# Patient Record
Sex: Female | Born: 1935 | Race: White | Hispanic: No | Marital: Single | Smoking: Never smoker
Health system: Southern US, Community
[De-identification: ages and names within clinical notes are randomized; demographics above are authoritative.]

## PROBLEM LIST (undated history)

## (undated) DIAGNOSIS — I10 Essential (primary) hypertension: Secondary | ICD-10-CM

## (undated) DIAGNOSIS — E785 Hyperlipidemia, unspecified: Secondary | ICD-10-CM

## (undated) DIAGNOSIS — E042 Nontoxic multinodular goiter: Secondary | ICD-10-CM

## (undated) HISTORY — DX: Hyperlipidemia, unspecified: E78.5

## (undated) HISTORY — DX: Nontoxic multinodular goiter: E04.2

## (undated) HISTORY — DX: Essential (primary) hypertension: I10

## (undated) HISTORY — PX: ABDOMINAL HYSTERECTOMY: SHX81

---

## 2007-02-16 ENCOUNTER — Encounter: Payer: Self-pay | Admitting: Family Medicine

## 2007-10-26 ENCOUNTER — Encounter: Payer: Self-pay | Admitting: Family Medicine

## 2008-04-10 ENCOUNTER — Encounter: Payer: Self-pay | Admitting: Family Medicine

## 2009-09-26 ENCOUNTER — Ambulatory Visit: Payer: Self-pay | Admitting: Family Medicine

## 2009-09-26 DIAGNOSIS — E041 Nontoxic single thyroid nodule: Secondary | ICD-10-CM | POA: Insufficient documentation

## 2009-09-26 DIAGNOSIS — R030 Elevated blood-pressure reading, without diagnosis of hypertension: Secondary | ICD-10-CM | POA: Insufficient documentation

## 2009-09-26 DIAGNOSIS — R1012 Left upper quadrant pain: Secondary | ICD-10-CM | POA: Insufficient documentation

## 2009-09-27 DIAGNOSIS — E785 Hyperlipidemia, unspecified: Secondary | ICD-10-CM

## 2009-09-29 LAB — CONVERTED CEMR LAB
ALT: 18 units/L (ref 0–35)
Albumin: 4.3 g/dL (ref 3.5–5.2)
Alkaline Phosphatase: 53 units/L (ref 39–117)
BUN: 12 mg/dL (ref 6–23)
Creatinine, Ser: 0.89 mg/dL (ref 0.40–1.20)
HDL: 69 mg/dL (ref >39)
Hemoglobin: 14.7 g/dL (ref 12.0–15.0)
Lymphs Abs: 1.9 10*3/uL (ref 0.7–4.0)
MCV: 89.1 fL (ref 78.0–100.0)
Monocytes Relative: 7 % (ref 3–12)
Neutrophils Relative %: 62 % (ref 43–77)
Platelets: 198 10*3/uL (ref 150–400)
Potassium: 4.3 meq/L (ref 3.5–5.3)
RBC: 5.04 M/uL (ref 3.87–5.11)
Triglycerides: 91 mg/dL (ref <150)
VLDL: 18 mg/dL (ref 0–40)
WBC: 6.3 10*3/uL (ref 4.0–10.5)

## 2009-11-27 ENCOUNTER — Ambulatory Visit: Payer: Self-pay | Admitting: Family Medicine

## 2009-11-27 DIAGNOSIS — I499 Cardiac arrhythmia, unspecified: Secondary | ICD-10-CM | POA: Insufficient documentation

## 2009-11-27 DIAGNOSIS — L719 Rosacea, unspecified: Secondary | ICD-10-CM

## 2009-11-27 DIAGNOSIS — Z780 Asymptomatic menopausal state: Secondary | ICD-10-CM | POA: Insufficient documentation

## 2009-12-11 ENCOUNTER — Telehealth (INDEPENDENT_AMBULATORY_CARE_PROVIDER_SITE_OTHER): Payer: Self-pay | Admitting: *Deleted

## 2009-12-31 ENCOUNTER — Ambulatory Visit: Payer: Self-pay | Admitting: Family Medicine

## 2010-01-01 ENCOUNTER — Telehealth: Payer: Self-pay | Admitting: Family Medicine

## 2010-01-05 ENCOUNTER — Ambulatory Visit: Payer: Self-pay | Admitting: Family Medicine

## 2010-01-05 LAB — CONVERTED CEMR LAB: OCCULT 1: POSITIVE

## 2010-01-06 ENCOUNTER — Encounter: Admission: RE | Admit: 2010-01-06 | Discharge: 2010-01-06 | Payer: Self-pay | Admitting: Family Medicine

## 2010-01-06 ENCOUNTER — Encounter: Payer: Self-pay | Admitting: Family Medicine

## 2010-01-06 LAB — HM MAMMOGRAPHY: HM Mammogram: NEGATIVE

## 2010-01-08 DIAGNOSIS — M810 Age-related osteoporosis without current pathological fracture: Secondary | ICD-10-CM

## 2010-01-20 LAB — HM COLONOSCOPY: HM Colonoscopy: NORMAL

## 2010-01-21 ENCOUNTER — Encounter: Payer: Self-pay | Admitting: Family Medicine

## 2010-02-05 ENCOUNTER — Encounter: Payer: Self-pay | Admitting: Family Medicine

## 2010-09-15 NOTE — Assessment & Plan Note (Signed)
Summary: CPE   Vital Signs:  Patient profile:   77 year old female Height:      63.5 inches Weight:      127 pounds BMI:     22.22 O2 Sat:      99 % on Room air Pulse rate:   72 / minute BP sitting:   158 / 82  (left arm) Cuff size:   regular  Vitals Entered By: Payton Spark CMA (November 27, 2009 8:15 AM)  O2 Flow:  Room air CC: CPE   Primary Care Provider:  Seymour Bars DO  CC:  CPE.  History of Present Illness: 77 yo WF postmenopausal here with CPE w/o pap.  She had a TAH for fibroids years ago, not on HRT.  Denies vag bleeding, dishcarge or pelvic pain.  She is divorced and not sexually active.  I do not have her records from [place] for her vaccine hx.  She has hx of HTN and High chol but she is not taking any meds.  Her labs were updated in Feb including TSH for hx of thyroid nodules but all was normal.  Her mammogram and DEXA are due.  She is overdue for a colonoscopy but refuses.  She agrees to do stool cards.  She is starting to walk more.  Denies CP or DOE.  Eats healthy.  She plans to call Emire[facility] for annual eye exam.    Current Medications (verified): 1)  None  Allergies (verified): 1)  ! Lipitor  Past History:  Past Medical History: Reviewed history from 09/26/2009 and no changes required. HTN High chol, refused statins after having myalgias Hx of thyroid nodules  Past Surgical History: Reviewed history from 09/26/2009 and no changes required. TAH w/o oophorectomy for fibroids  Family History: Reviewed history from 09/26/2009 and no changes required. brother committed suicide at age 46 mother died in her 102s from CHF father died in his 96s, AMI Sister died premenopausal breast cancer  Social History: Reviewed history from 09/26/2009 and no changes required. Homemaker.  Divorced. Has 3 sons and 6 grandkids all local. Never smoked.  Denies ETOH. Walks for exercise.  Review of Systems  The patient denies anorexia, fever, weight loss, weight  gain, vision loss, decreased hearing, hoarseness, chest pain, syncope, dyspnea on exertion, peripheral edema, prolonged cough, headaches, hemoptysis, abdominal pain, melena, hematochezia, severe indigestion/heartburn, hematuria, incontinence, genital sores, muscle weakness, suspicious skin lesions, transient blindness, difficulty walking, depression, unusual weight change, abnormal bleeding, enlarged lymph nodes, angioedema, breast masses, and testicular masses.    Physical Exam  General:  alert, well-developed, well-nourished, and well-hydrated.   Head:  normocephalic and atraumatic.   Eyes:  clouding of the anterior chambers bilat, PERRLA Ears:  TM scaring o/w normal bilat Nose:  no nasal discharge.   Mouth:  good dentition and pharynx pink and moist.   Neck:  no masses.  no audible carotid bruits Breasts:  No mass, nodules, thickening, tenderness, bulging, retraction, inflamation, nipple discharge or skin changes noted.   Lungs:  Normal respiratory effort, chest expands symmetrically. Lungs are clear to auscultation, no crackles or wheezes. Heart:  Reg rate with short pauses, no rubs or gallops.  no audible murmurs Abdomen:  Bowel sounds positive,abdomen soft and non-tender without masses, organomegaly or hernias noted. Msk:  arthritis nodes over finger joints bilat foot bunions Pulses:  2+ radial and pedal pulses Extremities:  no UE or LE edema Neurologic:  gait normal.   Skin:  mild rosacea.  abundant SKs and  sun damaged skin over upper chest Cervical Nodes:  No lymphadenopathy noted Psych:  good eye contact, not anxious appearing, and not depressed appearing.     Impression & Recommendations:  Problem # 1:  ROUTINE GENERAL MEDICAL EXAM@HEALTH  CARE FACL (ICD-V70.0) Keeping healthy checklist for women reviewed. BP high.  I am going to start her on DASH diet + regular walking routine and recheck at a nurse visit in 3 wks.  If still >140/90, start BP meds. BMI at goal. Update  mammogram and DEXA. Labs updated 09-2009. Need records for immunization hx.   EKG sinus brady at 59 bpm - normal axis, no ischemia. Pt will  update dental and eye exam. Will refer to derm locally. Stool cards given to pt.  Other Orders: T-Mammography Bilateral Screening (09381) T-DXA Bone Density/ Appendicular (82993) T-Dual DXA Bone Density/ Axial (71696) Dermatology Referral (Derma) EKG w/ Interpretation (93000)  Patient Instructions: 1)  Work on low sodium diet with walking 45 min 4-5 days/ wk. 2)  REturn for a nurse visit BP check in 3 wks.  If >140/90, will start BP meds. 3)  Update mammogram and DEXA downstairs. 4)  Labs normal Feb 2011. 5)  Call dentist and eye doctor for appts. 6)  Will set you up with local deramatologist.

## 2010-09-15 NOTE — Letter (Signed)
Summary: Jasmine December MD  Jasmine December MD   Imported By: Lanelle Bal 02/18/2010 10:14:44  _____________________________________________________________________  External Attachment:    Type:   Image     Comment:   External Document

## 2010-09-15 NOTE — Miscellaneous (Signed)
Summary: colonoscopy  Clinical Lists Changes  Observations: Added new observation of PAST MED HX: HTN High chol, refused statins after having myalgias Hx of thyroid nodules  GI Dr Jacqulyn Bath ([facility]) (01/21/2010 11:59) Added new observation of PRIMARY MD: Seymour Bars DO (01/21/2010 11:59) Added new observation of COLONRECACT: Further recommendations pending biopsy results.   (01/20/2010 12:00) Added new observation of COLONOSCOPY: Location:  [facility]   nodule ascending colon, biopsied. o/w normal (01/20/2010 12:00)      Colonoscopy  Procedure date:  01/20/2010  Findings:      Location:  [facility]   nodule ascending colon, biopsied. o/w normal  Comments:      Further recommendations pending biopsy results.     Colonoscopy  Procedure date:  01/20/2010  Findings:      Location:  [facility]   nodule ascending colon, biopsied. o/w normal  Comments:      Further recommendations pending biopsy results.       Past History:  Past Medical History: HTN High chol, refused statins after having myalgias Hx of thyroid nodules  GI Dr Jacqulyn Bath ([facility])

## 2010-09-15 NOTE — Miscellaneous (Signed)
Summary: old records  Clinical Lists Changes  Observations: Added new observation of PAST MED HX: HTN High chol, refused statins after having myalgias Hx of thyroid nodules- 09 last U/S sensorineural hearing loss  GI Dr Jacqulyn Bath ([facility]) Stress test (nuclear) 09: normal but cannot r/o mild ischemia; EF 66% (02/05/2010 8:30) Added new observation of PRIMARY MD: Seymour Bars DO (02/05/2010 8:30)       Past History:  Past Medical History: HTN High chol, refused statins after having myalgias Hx of thyroid nodules- 09 last U/S sensorineural hearing loss  GI Dr Jacqulyn Bath ([facility]) Stress test (nuclear) 09: normal but cannot r/o mild ischemia; EF 66%

## 2010-09-15 NOTE — Progress Notes (Signed)
Summary: Apt  Phone Note Call from Patient   Summary of Call: Pt LMOM asking if she had apt tomorrow. I tried to call Pt back to inform her that she is not scheduled tomorrow but did not get an answer. Pts next scheduled apt is 12/18/09 for a nurse visit. Initial call taken by: Payton Spark CMA,  December 11, 2009 4:34 PM
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
## 2010-09-15 NOTE — Assessment & Plan Note (Signed)
Summary: Pneumovax / stool cards  Nurse Visit   Allergies: 1)  ! Lipitor Laboratory Results  Date/Time Received: 01/05/2010 Date/Time Reported: 01/05/2010  Stool - Occult Blood Hemmoccult #1: positive Hemoccult #2: positive Hemoccult #3: positive   Immunizations Administered:  Pneumonia Vaccine:    Vaccine Type: Pneumovax    Site: left deltoid    Mfr: Merck    Dose: 0.5 ml    Route: IM    Given by: Kathlene November    Exp. Date: 03/28/2011    Lot #: [id number]    VIS given: 03/13/96 version given Jan 05, 2010.  Orders Added: 1)  Pneumococcal Vaccine [90732] 2)  Admin 1st Vaccine [90471] 3)  Hemoccult Cards -3 specimans (take home) [82272] 4)  Gastroenterology Referral [GI]  Appended Document: Pneumovax / stool cards Pt notified of results and referral made to [facility]. KJ LPN

## 2010-09-15 NOTE — Assessment & Plan Note (Signed)
Summary: BP - good  Nurse Visit   Vital Signs:  Patient profile:   77 year old female Pulse rate:   77 / minute BP sitting:   113 / 68  (left arm) Cuff size:   regular  Vitals Entered By: Kathlene November (Dec 31, 2009 10:02 AM) CC: Follow-up HTN HPI: Taking Meds?not taking any current meds Side Effects?none Chest Pain, SOB, Dizziness?no A/P: HTN(401.1) At Goal?yes If no, MD will be notified. Follow-up in--  5 minutes was spent with the pt.  Current Medications (verified): 1)  None  Allergies (verified): 1)  ! Lipitor  Orders Added: 1)  Est. Patient Level I [60454]

## 2010-09-15 NOTE — Progress Notes (Signed)
Summary: Rosacea  Phone Note Call from Patient   Caller: Patient Summary of Call: Pt states she discussed her rosacea w/ you at one of her 2 visits. Pt would like to know if you will send in Rx for her. Pt has already been advised by Selena Batten and myself that she will most likely need an OV for this but Pt states you will remember. Please advise. Initial call taken by: Payton Spark CMA,  Jan 01, 2010 11:56 AM    New/Updated Medications: METROLOTION 0.75 % LOTN (METRONIDAZOLE) use two times a day as directed Prescriptions: METROLOTION 0.75 % LOTN (METRONIDAZOLE) use two times a day as directed  #59 ml x 1   Entered and Authorized by:   Seymour Bars DO   Signed by:   Seymour Bars DO on 01/02/2010   Method used:   Electronically to        [id number][facility] (retail)       [address]       [place], [place]  [address]       Ph: [phone] or [phone]       Fax: [phone]   RxID:   [id number]   Appended Document: Rosacea LMOM informing Pt of the above

## 2011-02-20 ENCOUNTER — Encounter: Payer: Self-pay | Admitting: Family Medicine

## 2011-02-22 ENCOUNTER — Encounter: Payer: Self-pay | Admitting: Family Medicine

## 2011-02-22 ENCOUNTER — Ambulatory Visit (INDEPENDENT_AMBULATORY_CARE_PROVIDER_SITE_OTHER): Payer: Medicare PPO | Admitting: Family Medicine

## 2011-02-22 VITALS — BP 127/74 | HR 69 | Ht 64.0 in | Wt 131.0 lb

## 2011-02-22 DIAGNOSIS — Z0000 Encounter for general adult medical examination without abnormal findings: Secondary | ICD-10-CM

## 2011-02-22 DIAGNOSIS — L570 Actinic keratosis: Secondary | ICD-10-CM

## 2011-02-22 DIAGNOSIS — R5383 Other fatigue: Secondary | ICD-10-CM

## 2011-02-22 DIAGNOSIS — Z13220 Encounter for screening for lipoid disorders: Secondary | ICD-10-CM

## 2011-02-22 DIAGNOSIS — E042 Nontoxic multinodular goiter: Secondary | ICD-10-CM

## 2011-02-22 DIAGNOSIS — Z23 Encounter for immunization: Secondary | ICD-10-CM

## 2011-02-22 MED ORDER — TETANUS-DIPHTH-ACELL PERTUSSIS 5-2.5-18.5 LF-MCG/0.5 IM SUSP: 0.5000 mL | Freq: Once | INTRAMUSCULAR | Status: DC

## 2011-02-22 NOTE — Patient Instructions (Addendum)
Update fasting labs one morning downstairs.  Will set you up for a thyroid u/s downstairs.  Will set up a dermatology referral for you.  Return for next physical in 1 yr, in 3 mos for a flu shot only.

## 2011-02-22 NOTE — Progress Notes (Signed)
  Subjective:    Patient ID: Evelyn Greer, female    DOB: 1933/10/16, 77 y.o.   MRN: [id number]  HPI  77 yo WF presents for CPE w/o pap.  She is a healthy lady, divorced.  Active w/ her friends here, walks on a regular basis.  She has hx of thyroid nodules found on u/s in 09 and has felt a fullness on the L side of her neck lately.  She is due for fasting labs.  Had a normal colonoscopy June 2011 and a normal stress test in 2009.  She had a DEXA 12-2009 that showed osteopenia and is taking Caltrate D daily.  She had a mammogram in May of last year and is due to repeat w/ her eye exam.  Had PNX in May of last year.  Had a normal EKG last year.  Denies CP, DOE, palpitations or swelling.  BP 127/74  Pulse 69  Ht 5\' 4"  (1.626 m)  Wt 131 lb (59.421 kg)  BMI 22.49 kg/m2  SpO2 96% Patient Active Problem List  Diagnoses  . THYROID NODULE  . HYPERLIPIDEMIA  . CARDIAC ARRHYTHMIA  . ROSACEA  . OSTEOPENIA  . LUQ PAIN  . ELEVATED BLOOD PRESSURE  . POSTMENOPAUSAL STATUS       Review of Systems  Constitutional: Negative for fever, appetite change, fatigue and unexpected weight change.  HENT: Negative for congestion.   Eyes: Negative for visual disturbance.  Respiratory: Negative for shortness of breath.   Cardiovascular: Negative for chest pain, palpitations and leg swelling.  Gastrointestinal: Negative for nausea, vomiting, diarrhea, constipation and blood in stool.  Genitourinary: Negative for difficulty urinating.  Musculoskeletal: Negative for arthralgias.  Neurological: Negative for weakness and headaches.  Hematological: Negative for adenopathy.  Psychiatric/Behavioral: Negative for sleep disturbance and dysphoric mood. The patient is not nervous/anxious.        Objective:   Physical Exam  Constitutional: She appears well-developed and well-nourished.  HENT:  Mouth/Throat: Oropharynx is clear and moist.  Eyes: Conjunctivae are normal. No scleral icterus.       Anterior chamber  clouding  Neck: Neck supple. No thyromegaly present.       No audible carotid bruits  Cardiovascular: Normal rate, regular rhythm, normal heart sounds and intact distal pulses.   No murmur heard. Pulmonary/Chest: Effort normal and breath sounds normal. No respiratory distress.  Abdominal: Soft. Bowel sounds are normal. She exhibits no distension. There is no tenderness. There is no guarding.       No HSM or audibe AA bruits  Musculoskeletal: She exhibits no edema.       bilat bunion deformities  Lymphadenopathy:    She has no cervical adenopathy.  Neurological: She has normal reflexes.  Skin: Skin is warm and dry.  Psychiatric: She has a normal mood and affect.          Assessment & Plan:  Assesment:  1. CPE- Keeping healthy checklist for women reviewed today.  BP at goal.  BMI normal. Labs ordered Colonoscopy done 6-11 Mammogram done  12-2009, declined to repeat this year. Encouraged healthy diet, regular exercise, MVI daily. Return for next physical in 1 yr.   Continue Caltrate D daily. Tdap given today. Update an eye exam.  Schedule derm appt.

## 2011-02-26 LAB — CBC WITH DIFFERENTIAL/PLATELET
Basophils Relative: 0 % (ref 0–1)
Eosinophils Absolute: 0.2 10*3/uL (ref 0.0–0.7)
Eosinophils Relative: 3 % (ref 0–5)
HCT: 43.6 % (ref 36.0–46.0)
Hemoglobin: 13.7 g/dL (ref 12.0–15.0)
Monocytes Relative: 6 % (ref 3–12)
Neutro Abs: 4.4 10*3/uL (ref 1.7–7.7)
Neutrophils Relative %: 71 % (ref 43–77)
Platelets: 185 10*3/uL (ref 150–400)
WBC: 6.2 10*3/uL (ref 4.0–10.5)

## 2011-02-26 LAB — LIPID PANEL
Cholesterol: 263 mg/dL — ABNORMAL HIGH (ref 0–200)
HDL: 67 mg/dL (ref >39)
LDL Cholesterol: 182 mg/dL — ABNORMAL HIGH (ref 0–99)
Total CHOL/HDL Ratio: 3.9 Ratio
Triglycerides: 70 mg/dL (ref <150)
VLDL: 14 mg/dL (ref 0–40)

## 2011-02-27 LAB — COMPLETE METABOLIC PANEL WITH GFR
Albumin: 4.3 g/dL (ref 3.5–5.2)
Creat: 0.87 mg/dL (ref 0.50–1.10)
GFR, Est African American: >60 mL/min (ref >60)
GFR, Est Non African American: >60 mL/min (ref >60)
Potassium: 4.2 mEq/L (ref 3.5–5.3)
Sodium: 142 mEq/L (ref 135–145)
Total Protein: 7 g/dL (ref 6.0–8.3)

## 2011-03-01 ENCOUNTER — Telehealth: Payer: Self-pay | Admitting: Family Medicine

## 2011-03-01 NOTE — Telephone Encounter (Signed)
I def recommend a chol lowering Med at bedtime. Will start a med and then will need repeat labs in 8 weeks. Please enter her pharm and send the med. Thank you.

## 2011-03-01 NOTE — Telephone Encounter (Signed)
Pt was notified that we need to start a chol lowering medication due to her numbers on her fasting chol panel, but pt wants to try to get the numbers down herself first. Plan:  Low fat, low chol diet and exercise was discussed in great detail with the pt, and told the patient I will send a mess to the provider to inform her that she doesn't want to start the medication at this point. Routed to Dr. Marlyne Beards, LPN [facility]

## 2011-03-01 NOTE — Telephone Encounter (Signed)
That is up to her. Even with teh best dietary and exercise changes typically only expect a 20% decline which won't be enough to her get to gaol but we can try and then recheck in 8 weeks. She can also add flax seed to her diet and this will help too!

## 2011-03-01 NOTE — Telephone Encounter (Signed)
Pt called and wanted her lab results from last week.  All the labs looked good except the fasting cholesterol panel.  The bad numbers were discussed with the pt but since Dr. Cathey Endow out of the office, this message to review the lab panel was sent to Dr. Linford Arnold to address.  Pt is not on chol lowering medication at this time. Plan:  Routed the call to Dr. Marlyne Beards, LPN [facility]

## 2011-03-02 MED ORDER — NIACIN ER (ANTIHYPERLIPIDEMIC) 500 MG PO TBCR: 500.0000 mg | EXTENDED_RELEASE_TABLET | Freq: Every day | ORAL | Status: DC

## 2011-03-02 NOTE — Telephone Encounter (Signed)
Pt notified and had to [facility] that even with dietary and exercise changes successfully will still only lower her numbers by 20%, and that  Would not be enough based on her numbers currently.  LMOM for her to also add flax seed oil to the regimen and she can call if she has any further questions.  Told to repeat the labs in 8 weeks. Jarvis Newcomer, LPN [facility]

## 2011-03-02 NOTE — Telephone Encounter (Signed)
Pt called back and she is willing to try Niaspan.  Send to the local pharm entered into the chart. Plan:  Routed to Dr. Marlyne Beards, LPN [facility]

## 2011-03-02 NOTE — Telephone Encounter (Signed)
She really needs a statin vs niaspan but if she is at least will to try that then I will send over.

## 2011-03-03 ENCOUNTER — Other Ambulatory Visit: Payer: Self-pay | Admitting: Family Medicine

## 2011-03-03 NOTE — Telephone Encounter (Signed)
Pt. Called and wants a copy of her most recent office visit, labs, and the med she was prescribed sent to her through the mail. Plan:  These request were mailed. Jarvis Newcomer, LPN [facility]

## 2011-03-06 NOTE — Telephone Encounter (Signed)
This encounter has been completed. Berkeley Vanaken, LPN /Triage  

## 2011-03-24 ENCOUNTER — Telehealth: Payer: Self-pay | Admitting: Family Medicine

## 2011-03-24 NOTE — Telephone Encounter (Signed)
Pt called and would like a copy of her lab results. Plan:  Mailed a copy. Jarvis Newcomer, LPN [facility]

## 2011-05-13 ENCOUNTER — Telehealth: Payer: Self-pay | Admitting: Family Medicine

## 2011-05-13 NOTE — Telephone Encounter (Signed)
Pt notified with instructions.  Told to repeat fasting chol labs in 8 weeks after taking the niaspan regularly for 8 weeks. Jarvis Newcomer, LPN [facility]

## 2011-05-13 NOTE — Telephone Encounter (Signed)
Taking a baby ASA withe evening meal and then taking the niaspan a couple of hours later will also counteract those symptoms.  It i snot an allergic reaction.  It will take some time but her body will get used to it and she won't feel this way every time she takes it.

## 2011-05-13 NOTE — Telephone Encounter (Signed)
Pt called and said she had a bad reaction to taking the niaspan.  Went to ED last night with flushing, itching and burning.   Pt told to follow up with the PCP. Plan:  As this medication was reviewed flushing and itching, and burning are normal responses of the medication.  Pt contacted to question about the symptoms.  Arms and legs red, itching, burning, and nose swelling, and got hard to breathe. Start in the face.  Itching everywhere.  Just started the niaspan and had 3 doses taken.  If I recall the conversation to start the niaspan was on 03-02-11, and the pt just started. Please advise if pt should completely stop.  Told the pt she should take the med at night and prior to taking eat a apple or some yogurt will ususally help with the symptoms that she is describing.  Routed to Dr. Linford Arnold for further recommendations. Jarvis Newcomer, LPN [facility]

## 2011-11-19 ENCOUNTER — Encounter: Payer: Self-pay | Admitting: *Deleted

## 2011-11-25 ENCOUNTER — Ambulatory Visit: Payer: Medicare PPO | Admitting: Family Medicine

## 2011-11-25 DIAGNOSIS — Z0289 Encounter for other administrative examinations: Secondary | ICD-10-CM

## 2011-12-01 ENCOUNTER — Encounter: Payer: Self-pay | Admitting: Family Medicine

## 2011-12-01 ENCOUNTER — Ambulatory Visit (INDEPENDENT_AMBULATORY_CARE_PROVIDER_SITE_OTHER): Payer: Medicare PPO | Admitting: Family Medicine

## 2011-12-01 VITALS — BP 135/77 | HR 100 | Ht 64.0 in | Wt 130.0 lb

## 2011-12-01 DIAGNOSIS — E785 Hyperlipidemia, unspecified: Secondary | ICD-10-CM

## 2011-12-01 DIAGNOSIS — L819 Disorder of pigmentation, unspecified: Secondary | ICD-10-CM

## 2011-12-01 DIAGNOSIS — E041 Nontoxic single thyroid nodule: Secondary | ICD-10-CM

## 2011-12-01 DIAGNOSIS — L814 Other melanin hyperpigmentation: Secondary | ICD-10-CM

## 2011-12-01 DIAGNOSIS — L570 Actinic keratosis: Secondary | ICD-10-CM

## 2011-12-01 NOTE — Patient Instructions (Addendum)
Think about the shingles vaccine.  Hypercholesterolemia High Blood Cholesterol Cholesterol is a white, waxy, fat-like protein needed by your body in small amounts. The liver makes all the cholesterol you need. It is carried from the liver by the blood through the blood vessels. Deposits (plaque) may build up on blood vessel walls. This makes the arteries narrower and stiffer. Plaque increases the risk for heart attack and stroke. You cannot feel your cholesterol level even if it is very high. The only way to know is by a blood test to check your lipid (fats) levels. Once you know your cholesterol levels, you should keep a record of the test results. Work with your caregiver to to keep your levels in the desired range. WHAT THE RESULTS MEAN:  Total cholesterol is a rough measure of all the cholesterol in your blood.   LDL is the so-called bad cholesterol. This is the type that deposits cholesterol in the walls of the arteries. You want this level to be low.   HDL is the good cholesterol because it cleans the arteries and carries the LDL away. You want this level to be high.   Triglycerides are fat that the body can either burn for energy or store. High levels are closely linked to heart disease.  DESIRED LEVELS:  Total cholesterol below 200.   LDL below 100 for people at risk, below 70 for very high risk.   HDL above 50 is good, above 60 is best.   Triglycerides below 150.  HOW TO LOWER YOUR CHOLESTEROL:  Diet.   Choose fish or white meat chicken and [place], roasted or baked. Limit fatty cuts of red meat, fried foods, and processed meats, such as sausage and lunch meat.   Eat lots of fresh fruits and vegetables. Choose whole grains, beans, pasta, potatoes and cereals.   Use only small amounts of olive, corn or canola oils. Avoid butter, mayonnaise, shortening or palm kernel oils. Avoid foods with trans-fats.   Use skim/nonfat milk and low-fat/nonfat yogurt and cheeses. Avoid whole  milk, cream, ice cream, egg yolks and cheeses. Healthy desserts include angel food cake, gingersnaps, animal crackers, hard candy, popsicles, and low-fat/nonfat frozen yogurt. Avoid pastries, cakes, pies and cookies.   Exercise.   A regular program helps decrease LDL and raises HDL.   Helps with weight control.   Do things that increase your activity level like gardening, walking, or taking the stairs.   Medication.   May be prescribed by your caregiver to help lowering cholesterol and the risk for heart disease.   You may need medicine even if your levels are normal if you have several risk factors.  HOME CARE INSTRUCTIONS    Follow your diet and exercise programs as suggested by your caregiver.   Take medications as directed.   Have blood work done when your caregiver feels it is necessary.  MAKE SURE YOU:    Understand these instructions.   Will watch your condition.   Will get help right away if you are not doing well or get worse.  Document Released: 08/02/2005 Document Revised: 07/22/2011 Document Reviewed: 01/18/2007 [facility] Patient Information 2012 [place], [place].

## 2011-12-01 NOTE — Progress Notes (Signed)
  Subjective:    Patient ID: Evelyn Greer, female    DOB: 08/29/1933, 78 y.o.   MRN: [id number]  HPI She would like me loook at some brown spots on her hands and her forearms. She says that she had a few before better the last year she has gotten a lot more. She does have a history of sun damage in the past. She also has 2 new lesions on her right posterior hand that popped up overnight but she would like me to look at. She denies any trauma to her hand.  Hyperlipidemia-her cholesterol was very high in July but she evidently declined treatment at the time. She says she really wants to work on walking which has helped her cholesterol levels in the past. She says she is extremely sensitive to medications and does not tolerate them well. She also had myalgias with Lipitor is very fearful of trying any other cholesterol medication no chest measurements of breath.    Review of Systems     Objective:   Physical Exam  Constitutional: She is oriented to person, place, and time. She appears well-developed and well-nourished.  HENT:  Head: Normocephalic and atraumatic.  Cardiovascular: Normal rate, regular rhythm and normal heart sounds.        No carotid bruits. Radial pulses 2+ bilaterally.  Pulmonary/Chest: Effort normal and breath sounds normal.  Musculoskeletal: She exhibits no edema.  Neurological: She is alert and oriented to person, place, and time.  Skin: Skin is warm and dry.       She has multiple solar the dose on her forearms and backs of her hands. On her right dorsum of hand she does have 2 circular erythematous lesions with a purple discoloration towards the center. These look more like bruises. She also has a small keratosis on her left forearm as well.  Psychiatric: She has a normal mood and affect. Her behavior is normal.          Assessment & Plan:  Solar Lentigos-Explained that this is secondary to sun damage. Cryotherapy can be performed for treatment she would  like.  Actinic keratosis on her left forearm-discussed also cryotherapy can be performed to treat the lesion which is precancerous.  She does have what appears to be 2 bruises on the dorsum of her right hand. Astra keep Janelle Floor is an excellent 2 weeks to see if they are resolving on her own and healing. If they do not then please let me know and please followup.  Hyperlipidemia-discussed the importance of reducing her risk for heart attack and stroke by treating her cholesterol. She really wants to work on exercise and diet for the next 4-6 weeks. She was given a lab slip today and she can get in 4-6 weeks to recheck her blood work. I suspect she would be very likely to try treatment with medications.  Thyroid nodule-she has a history multinodular border. Will check TSH as well.  We also discussed the shingles vaccine today. She says she will think about it and look into it. But she declined at this time.

## 2011-12-17 ENCOUNTER — Encounter: Payer: Self-pay | Admitting: Family Medicine

## 2011-12-17 ENCOUNTER — Telehealth: Payer: Self-pay | Admitting: Family Medicine

## 2011-12-17 DIAGNOSIS — E042 Nontoxic multinodular goiter: Secondary | ICD-10-CM

## 2011-12-17 NOTE — Telephone Encounter (Signed)
Phone num has been d/c 

## 2011-12-17 NOTE — Telephone Encounter (Signed)
I was able to find last [place] of thyroid was in 2009.  Recommend repeat to make sure nodules are stable since has been 4 years.

## 2011-12-17 NOTE — Telephone Encounter (Signed)
OK to mail letter

## 2011-12-22 ENCOUNTER — Encounter: Payer: Self-pay | Admitting: *Deleted

## 2011-12-27 ENCOUNTER — Telehealth: Payer: Self-pay | Admitting: *Deleted

## 2011-12-27 NOTE — Telephone Encounter (Signed)
[facility] states they will schedule.

## 2011-12-27 NOTE — Telephone Encounter (Signed)
Pt ask to have [place] of thyroid schedule in [place].

## 2011-12-27 NOTE — Telephone Encounter (Signed)
Ordered place let Jenn know to call [facility]

## 2011-12-29 LAB — LIPID PANEL
Cholesterol: 250 mg/dL — ABNORMAL HIGH (ref 0–200)
LDL Cholesterol: 171 mg/dL — ABNORMAL HIGH (ref 0–99)
Total CHOL/HDL Ratio: 4.1 Ratio
VLDL: 18 mg/dL (ref 0–40)

## 2011-12-29 LAB — COMPLETE METABOLIC PANEL WITH GFR
ALT: 16 U/L (ref 0–35)
Alkaline Phosphatase: 50 U/L (ref 39–117)
CO2: 27 mEq/L (ref 19–32)
Creat: 0.78 mg/dL (ref 0.50–1.10)
GFR, Est African American: 85 mL/min
GFR, Est Non African American: 74 mL/min
Total Protein: 6.7 g/dL (ref 6.0–8.3)

## 2012-01-14 ENCOUNTER — Other Ambulatory Visit: Payer: Medicare PPO

## 2012-01-14 ENCOUNTER — Ambulatory Visit
Admission: RE | Admit: 2012-01-14 | Discharge: 2012-01-14 | Disposition: A | Discharge status: Home / Self Care | Payer: Medicare PPO | Source: Ambulatory Visit | Attending: Family Medicine | Admitting: Family Medicine

## 2012-01-14 DIAGNOSIS — E042 Nontoxic multinodular goiter: Secondary | ICD-10-CM

## 2012-12-22 ENCOUNTER — Ambulatory Visit (INDEPENDENT_AMBULATORY_CARE_PROVIDER_SITE_OTHER): Payer: Managed Care, Other (non HMO) | Admitting: Physician Assistant

## 2012-12-22 ENCOUNTER — Encounter: Payer: Self-pay | Admitting: Physician Assistant

## 2012-12-22 VITALS — BP 122/61 | HR 84 | Wt 127.0 lb

## 2012-12-22 DIAGNOSIS — Z131 Encounter for screening for diabetes mellitus: Secondary | ICD-10-CM

## 2012-12-22 DIAGNOSIS — R109 Unspecified abdominal pain: Secondary | ICD-10-CM

## 2012-12-22 DIAGNOSIS — E785 Hyperlipidemia, unspecified: Secondary | ICD-10-CM

## 2012-12-22 DIAGNOSIS — N390 Urinary tract infection, site not specified: Secondary | ICD-10-CM

## 2012-12-22 LAB — POCT URINALYSIS DIPSTICK
Nitrite, UA: NEGATIVE
Protein, UA: NEGATIVE

## 2012-12-22 MED ORDER — CIPROFLOXACIN HCL 500 MG PO TABS: 500.0000 mg | ORAL_TABLET | Freq: Two times a day (BID) | ORAL | Status: DC

## 2012-12-22 NOTE — Patient Instructions (Signed)
Urinary Tract Infection Urinary tract infections (UTIs) can develop anywhere along your urinary tract. Your urinary tract is your body's drainage system for removing wastes and extra water. Your urinary tract includes two kidneys, two ureters, a bladder, and a urethra. Your kidneys are a pair of bean-shaped organs. Each kidney is about the size of your fist. They are located below your ribs, one on each side of your spine. CAUSES Infections are caused by microbes, which are microscopic organisms, including fungi, viruses, and bacteria. These organisms are so small that they can only be seen through a microscope. Bacteria are the microbes that most commonly cause UTIs. SYMPTOMS  Symptoms of UTIs may vary by age and gender of the patient and by the location of the infection. Symptoms in young women typically include a frequent and intense urge to urinate and a painful, burning feeling in the bladder or urethra during urination. Older women and men are more likely to be tired, shaky, and weak and have muscle aches and abdominal pain. A fever may mean the infection is in your kidneys. Other symptoms of a kidney infection include pain in your back or sides below the ribs, nausea, and vomiting. DIAGNOSIS To diagnose a UTI, your caregiver will ask you about your symptoms. Your caregiver also will ask to provide a urine sample. The urine sample will be tested for bacteria and white blood cells. White blood cells are made by your body to help fight infection. TREATMENT  Typically, UTIs can be treated with medication. Because most UTIs are caused by a bacterial infection, they usually can be treated with the use of antibiotics. The choice of antibiotic and length of treatment depend on your symptoms and the type of bacteria causing your infection. HOME CARE INSTRUCTIONS  If you were prescribed antibiotics, take them exactly as your caregiver instructs you. Finish the medication even if you feel better after you  have only taken some of the medication.  Drink enough water and fluids to keep your urine clear or pale yellow.  Avoid caffeine, tea, and carbonated beverages. They tend to irritate your bladder.  Empty your bladder often. Avoid holding urine for long periods of time.  Empty your bladder before and after sexual intercourse.  After a bowel movement, women should cleanse from front to back. Use each tissue only once. SEEK MEDICAL CARE IF:   You have back pain.  You develop a fever.  Your symptoms do not begin to resolve within 3 days. SEEK IMMEDIATE MEDICAL CARE IF:   You have severe back pain or lower abdominal pain.  You develop chills.  You have nausea or vomiting.  You have continued burning or discomfort with urination. MAKE SURE YOU:   Understand these instructions.  Will watch your condition.  Will get help right away if you are not doing well or get worse. Document Released: 05/12/2005 Document Revised: 02/01/2012 Document Reviewed: 09/10/2011 [employer] Patient Information 2013 [employer].  

## 2012-12-22 NOTE — Progress Notes (Signed)
  Subjective:    Patient ID: Evelyn Greer, female    DOB: 1934/06/29, 79 y.o.   MRN: [id number]  HPI Patient presents to the clinic with right flank pain for last 2 weeks. Off and on sharp pain but not constant. Not tried anything to make worse and nothing makes better. Denies any fever, chills, muscle aches. She has had some urinary frequency and hesitancy. Denies any fatigue. Denies any trauma or new activities that could be causing pain. NO radiation of pain down legs.    Hyperlipidemia- Taking red yeast rice. Has been told that she needed a statin but declined.    Review of Systems     Objective:   Physical Exam  Constitutional: She is oriented to person, place, and time. She appears well-developed and well-nourished.  HENT:  Head: Normocephalic and atraumatic.  Cardiovascular: Normal rate, regular rhythm and normal heart sounds.   Pulmonary/Chest: Effort normal and breath sounds normal.  NO CVA tenderness.   Neurological: She is alert and oriented to person, place, and time.  Skin: Skin is warm and dry.  Psychiatric: She has a normal mood and affect. Her behavior is normal.          Assessment & Plan:  UTI/Right flank pain- UA positive for blood and leuks. Treated with cipro. Not certain back pain is from infection. Pt given ho on symptomatic control. If not improving call office and we can reevaluate back pain.   Hyperlipidemia- gave lab slip to have cholesterol checked. Pt taking red yeast rice.

## 2013-03-22 ENCOUNTER — Encounter: Payer: Self-pay | Admitting: Family Medicine

## 2013-03-22 ENCOUNTER — Ambulatory Visit (INDEPENDENT_AMBULATORY_CARE_PROVIDER_SITE_OTHER): Payer: Managed Care, Other (non HMO) | Admitting: Family Medicine

## 2013-03-22 VITALS — BP 120/68 | HR 68 | Ht 64.0 in | Wt 129.0 lb

## 2013-03-22 DIAGNOSIS — E041 Nontoxic single thyroid nodule: Secondary | ICD-10-CM

## 2013-03-22 DIAGNOSIS — E785 Hyperlipidemia, unspecified: Secondary | ICD-10-CM

## 2013-03-22 DIAGNOSIS — Z780 Asymptomatic menopausal state: Secondary | ICD-10-CM

## 2013-03-22 DIAGNOSIS — M949 Disorder of cartilage, unspecified: Secondary | ICD-10-CM

## 2013-03-22 DIAGNOSIS — M899 Disorder of bone, unspecified: Secondary | ICD-10-CM

## 2013-03-22 DIAGNOSIS — R011 Cardiac murmur, unspecified: Secondary | ICD-10-CM

## 2013-03-22 NOTE — Progress Notes (Signed)
Subjective:    Patient ID: Evelyn Greer, female    DOB: Nov 15, 1933, 79 y.o.   MRN: [id number]  HPI F/u hyperlipidemia - She doesn't want to take a statin.  She is on [employer].  She never went for repeat labs.  SHe says she wants to work on her diet and exercise. No regular exercise currently.   Sinus sxs x 2 days. Mild nasal congestion.  No cough. No fever.   Multinodular goiter-no major changes in weight skin or hair. She overall but says she feels well. She says the only thing that really bothers her and stresses her is where she lives. She currently lives alone but doesn't feel very safe in her neighborhood. There people trying to break into homes and that makes her nervous.   Review of Systems  BP 120/68  Pulse 68  Ht 5\' 4"  (1.626 m)  Wt 129 lb (58.514 kg)  BMI 22.13 kg/m2    Allergies  Allergen Reactions  . Atorvastatin     REACTION: Pain in legs    Past Medical History  Diagnosis Date  . Hypertension   . Hyperlipidemia   . Multiple thyroid nodules     nodules 2009 last U/S  . Hearing loss     sensorineural    Past Surgical History  Procedure Laterality Date  . Abdominal hysterectomy      TAH w/o oophrectomy for fibroids    History   Social History  . Marital Status: Single    Spouse Name: N/A    Number of Children: N/A  . Years of Education: N/A   Occupational History  . Not on file.   Social History Main Topics  . Smoking status: Never Smoker   . Smokeless tobacco: Not on file  . Alcohol Use: No  . Drug Use:   . Sexually Active:    Other Topics Concern  . Not on file   Social History Narrative  . No narrative on file    Family History  Problem Relation Age of Onset  . Heart disease Mother 15    CHF  . Heart disease Father 75    heart attack  . Cancer Sister     premenopausal breast cancer  . Depression Brother 17    committed suicide     Outpatient Encounter Prescriptions as of 03/22/2013  Medication Sig Dispense Refill   . b complex vitamins tablet Take 1 tablet by mouth daily.      . Multiple Minerals-Vitamins (CALCIUM & VIT D3 BONE HEALTH PO) Take by mouth.      . RED YEAST RICE EXTRACT PO Take by mouth.      . vitamin C (ASCORBIC ACID) 500 MG tablet Take 500 mg by mouth daily.      . [DISCONTINUED] ciprofloxacin (CIPRO) 500 MG tablet Take 1 tablet (500 mg total) by mouth 2 (two) times daily. For 7 days  14 tablet  0  . [DISCONTINUED] Red Yeast Rice 600 MG CAPS Take 1 capsule by mouth.       No facility-administered encounter medications on file as of 03/22/2013.          Objective:   Physical Exam  Constitutional: She is oriented to person, place, and time. She appears well-developed and well-nourished.  HENT:  Head: Normocephalic and atraumatic.  Right Ear: External ear normal.  Left Ear: External ear normal.  Nose: Nose normal.  Mouth/Throat: Oropharynx is clear and moist.  TMs and canals are clear.  Eyes: Conjunctivae and EOM are normal. Pupils are equal, round, and reactive to light.  Neck: Neck supple. No thyromegaly present.  Cardiovascular: Normal rate, regular rhythm and normal heart sounds.   2/6 SEM with a loud and sharp S2   Pulmonary/Chest: Effort normal and breath sounds normal. She has no wheezes.  Lymphadenopathy:    She has no cervical adenopathy.  Neurological: She is alert and oriented to person, place, and time.  Skin: Skin is warm and dry.  Psychiatric: She has a normal mood and affect.          Assessment & Plan:  Hyperlipidemia - we had a discussion about current guidelines for cholesterol treatment. She opts not to try a statin. She did not do well with Lipitor in the past and really wants to stay within the entire class of drugs. She's currently taking red yeast rice. She says she plans on working on exercise and diet. When I look back at what she has told me at each visit. I did strongly encourage her to try to get her lab work it has been well over a year since  the last check. Hopefully she will go.  URi - call if not better in one week.   History of thyroid nodule-will recheck TSH. The last ultrasound was approximately a year and half ago and was consistent with multinodular goiter.  Osteopenia-she's overdue for bone density scan. I will place the order today. She does take her calcium and vitamin D regularly.  Murmur-will schedule for echocardiogram. I do not member hearing is normal perform and I look back at my previous notes is not noted. I suspect some element of valvular stenosis especially with her long-standing history of hyperlipidemia that has been untreated.

## 2013-03-23 LAB — TSH: TSH: 2.686 u[IU]/mL (ref 0.350–4.500)

## 2013-04-03 ENCOUNTER — Ambulatory Visit (INDEPENDENT_AMBULATORY_CARE_PROVIDER_SITE_OTHER): Payer: PRIVATE HEALTH INSURANCE

## 2013-04-03 ENCOUNTER — Telehealth: Payer: Self-pay | Admitting: Family Medicine

## 2013-04-03 DIAGNOSIS — M899 Disorder of bone, unspecified: Secondary | ICD-10-CM

## 2013-04-03 DIAGNOSIS — Z780 Asymptomatic menopausal state: Secondary | ICD-10-CM

## 2013-04-03 NOTE — Telephone Encounter (Signed)
Patient had bone density today and got dizziness.  Evelyn Greer from radiology walked her up here and she wanted to leave message for someone to call her.  [phone] is the number.  thanks

## 2013-04-03 NOTE — Telephone Encounter (Signed)
F/u w/pt pt stated that she thinks that she had an episode of Vertigo. Asked her if she was still feeling this way she is not having any problems now.Laureen Ochs, Viann Shove

## 2013-08-03 ENCOUNTER — Telehealth: Payer: Self-pay

## 2013-08-03 NOTE — Telephone Encounter (Signed)
Patient has made 31 calls to the police department, about intruders on her property, over the last year. The police did not see any evidence of any intruders.  The police and family is concerned about patient being paranoid. Adult protective services has been called. Shanda Bumps from adult protective services is calling to find out if this behavior is different. Has she had known episodes of paranoia?

## 2013-08-03 NOTE — Telephone Encounter (Signed)
Left detailed message for Jessica to return my call.

## 2013-08-03 NOTE — Telephone Encounter (Signed)
No known episodes here. Amber has faxed over the last couple of office visit note. She's only been here twice in the last year.

## 2013-08-21 ENCOUNTER — Encounter: Payer: Managed Care, Other (non HMO) | Admitting: Family Medicine

## 2013-08-24 ENCOUNTER — Ambulatory Visit (INDEPENDENT_AMBULATORY_CARE_PROVIDER_SITE_OTHER): Payer: PRIVATE HEALTH INSURANCE | Admitting: Family Medicine

## 2013-08-24 ENCOUNTER — Encounter: Payer: Self-pay | Admitting: Family Medicine

## 2013-08-24 VITALS — BP 113/70 | HR 103 | Temp 99.4°F | Wt 123.0 lb

## 2013-08-24 DIAGNOSIS — R509 Fever, unspecified: Secondary | ICD-10-CM

## 2013-08-24 DIAGNOSIS — J111 Influenza due to unidentified influenza virus with other respiratory manifestations: Secondary | ICD-10-CM

## 2013-08-24 DIAGNOSIS — J101 Influenza due to other identified influenza virus with other respiratory manifestations: Secondary | ICD-10-CM

## 2013-08-24 LAB — POCT INFLUENZA A/B: Influenza A, POC: POSITIVE

## 2013-08-24 MED ORDER — OSELTAMIVIR PHOSPHATE 75 MG PO CAPS: 75.0000 mg | ORAL_CAPSULE | Freq: Two times a day (BID) | ORAL | Status: DC

## 2013-08-24 NOTE — Progress Notes (Signed)
CC: Evelyn Greer is a 80 y.o. female is here for Cough, Dizziness and Generalized Body Aches   Subjective: HPI:  Reports nonproductive cough that began early this morning accompanied by fatigue and a general sense of feeling sick. No interventions as of yet. Nothing particularly makes symptoms better or worse. She denies known sick contacts. She denies fevers, chills, shortness of breath, blood in sputum, facial pressure, dizziness, motor or sensory disturbances nor chest pain. Denies diarrhea but does endorse decreased appetite with increased thirst. Review of systems positive for nasal congestion and questionable body aches   Review Of Systems Outlined In HPI  Past Medical History  Diagnosis Date  . Hypertension   . Hyperlipidemia   . Multiple thyroid nodules     nodules 2009 last U/S  . Hearing loss     sensorineural     Family History  Problem Relation Age of Onset  . Heart disease Mother 27    CHF  . Heart disease Father 54    heart attack  . Cancer Sister     premenopausal breast cancer  . Depression Brother 48    committed suicide      History  Substance Use Topics  . Smoking status: Never Smoker   . Smokeless tobacco: Not on file  . Alcohol Use: No     Objective: Filed Vitals:   08/24/13 0938  BP: 113/70  Pulse: 103  Temp: 99.4 F (37.4 C)    General: Alert and Oriented, No Acute Distress HEENT: Pupils equal, round, reactive to light. Conjunctivae clear.  External ears unremarkable, canals clear with intact TMs with appropriate landmarks.  Middle ear appears open without effusion. Pink inferior turbinates.  Moist mucous membranes, pharynx without inflammation nor lesions.  Neck supple without palpable lymphadenopathy nor abnormal masses. Lungs: Clear to auscultation bilaterally, no wheezing/ronchi/rales.  Comfortable work of breathing. Good air movement. Cardiac: Regular rate and rhythm. Normal S1/S2.  No murmurs, rubs, nor gallops.   Extremities: No  peripheral edema.  Strong peripheral pulses.  Mental Status: No depression, anxiety, nor agitation. Skin: Warm and dry.  Assessment & Plan: Evelyn Greer was seen today for cough, dizziness and generalized body aches.  Diagnoses and associated orders for this visit:  Fever, unspecified - POCT Influenza A/B - oseltamivir (TAMIFLU) 75 MG capsule; Take 1 capsule (75 mg total) by mouth 2 (two) times daily.  Influenza A - oseltamivir (TAMIFLU) 75 MG capsule; Take 1 capsule (75 mg total) by mouth 2 (two) times daily.    Rapid flu is positive for influenza A.: Start Tamiflu immediately, stay well hydrated with soups and sugary beverages, consider 1 g of acetaminophen every 8 hours.Signs and symptoms requring emergent/urgent reevaluation were discussed with the patient.  Return if symptoms worsen or fail to improve.

## 2014-02-07 ENCOUNTER — Encounter: Payer: PRIVATE HEALTH INSURANCE | Admitting: Family Medicine

## 2014-02-07 DIAGNOSIS — Z0289 Encounter for other administrative examinations: Secondary | ICD-10-CM

## 2014-03-12 ENCOUNTER — Encounter: Payer: Self-pay | Admitting: Family Medicine

## 2014-03-12 ENCOUNTER — Ambulatory Visit (INDEPENDENT_AMBULATORY_CARE_PROVIDER_SITE_OTHER): Payer: 59 | Admitting: Family Medicine

## 2014-03-12 VITALS — BP 115/66 | HR 74 | Wt 127.0 lb

## 2014-03-12 DIAGNOSIS — R1310 Dysphagia, unspecified: Secondary | ICD-10-CM

## 2014-03-12 DIAGNOSIS — E785 Hyperlipidemia, unspecified: Secondary | ICD-10-CM

## 2014-03-12 NOTE — Progress Notes (Signed)
Subjective:    Patient ID: Evelyn Greer, female    DOB: 1933-10-23, 80 y.o.   MRN: [id number]  HPI  Difficulty swallowing x6 months. Feels like a lump at the base of her throat.  Says feels it occ shen swallowing foods.  No pain.  She denies food actually getting stuck and causing actual choking or vomiting. It does not happen daily. She has not noticed a pattern with certain foods. She says sometimes she notices it even with liquids. She's never had problems with esophageal stricture. She denies any heartburn or reflux symptoms. She is concerned and came in today because a friend of hers passed away from throat cancer.  Hyperlipidemia - she's been really working really hard on her diet to help control her cholesterol. She's also been trying to walk for exercise as well. Her father had a history of heart disease.  She also has several hyperpigmented lesions on her forearms and upper chest. She has had a fair amount of exposure for her lifetime. She wants to know what it is and how to treat it.  Review of Systems  BP 115/66  Pulse 74  Wt 127 lb (57.607 kg)  SpO2 97%    Allergies  Allergen Reactions  . Atorvastatin     REACTION: Pain in legs    Past Medical History  Diagnosis Date  . Hypertension   . Hyperlipidemia   . Multiple thyroid nodules     nodules 2009 last U/S  . Hearing loss     sensorineural    Past Surgical History  Procedure Laterality Date  . Abdominal hysterectomy      TAH w/o oophrectomy for fibroids    History   Social History  . Marital Status: Single    Spouse Name: N/A    Number of Children: N/A  . Years of Education: N/A   Occupational History  . Not on file.   Social History Main Topics  . Smoking status: Never Smoker   . Smokeless tobacco: Not on file  . Alcohol Use: No  . Drug Use:   . Sexual Activity:    Other Topics Concern  . Not on file   Social History Narrative   Lives alone    Family History  Problem Relation Age  of Onset  . Heart disease Mother 74    CHF  . Heart disease Father 31    heart attack  . Cancer Sister     premenopausal breast cancer  . Depression Brother 24    committed suicide     Outpatient Encounter Prescriptions as of 03/12/2014  Medication Sig  . b complex vitamins tablet Take 1 tablet by mouth daily.  . Multiple Minerals-Vitamins (CALCIUM & VIT D3 BONE HEALTH PO) Take by mouth.  . vitamin C (ASCORBIC ACID) 500 MG tablet Take 500 mg by mouth daily.  . [DISCONTINUED] oseltamivir (TAMIFLU) 75 MG capsule Take 1 capsule (75 mg total) by mouth 2 (two) times daily.  . [DISCONTINUED] RED YEAST RICE EXTRACT PO Take by mouth.          Objective:   Physical Exam  Constitutional: She is oriented to person, place, and time. She appears well-developed and well-nourished.  HENT:  Head: Normocephalic and atraumatic.  Right Ear: External ear normal.  Left Ear: External ear normal.  Nose: Nose normal.  Mouth/Throat: Oropharynx is clear and moist.  TMs and canals are clear.   Eyes: Conjunctivae and EOM are normal. Pupils are equal, round, and  reactive to light.  Neck: Neck supple. No thyromegaly present.  Cardiovascular: Normal rate, regular rhythm and normal heart sounds.   Pulmonary/Chest: Effort normal and breath sounds normal. She has no wheezes.  Lymphadenopathy:    She has no cervical adenopathy.  Neurological: She is alert and oriented to person, place, and time.  Skin: Skin is warm and dry.  Diffuse elevated as on her forearms and multiple half to 1 cm seborrheic keratoses on her upper chest.  Psychiatric: She has a normal mood and affect.          Assessment & Plan:  [id number] would like to refer her to a GI specialist for further evaluation. Based on the location of where she's feeling a sensation of the base of the throat behind the sternocleidomastoid notch I would recommend EGD to evaluate for esophageal stricture or possibly a swallowing study. She says she  really wants to think about it before she sees a specialist. Encouraged her to think about it over the next week or so and call me. Certainly if her symptoms worsen or become more frequent then she definitely needs to let me know.  Hyperlipidemia-due to recheck lipids today. They're still elevated consider statin.  Solar lentigo-I explained her that this is secondary to sun damage. Discussed that we can do light nitrogen spray to freeze the areas for treatment. Work and refer to dermatology for laser treatment.

## 2014-03-12 NOTE — Patient Instructions (Signed)
Think about the referral for swallowing to a GI doctor.

## 2014-03-13 ENCOUNTER — Encounter: Payer: Self-pay | Admitting: Family Medicine

## 2014-03-13 ENCOUNTER — Telehealth: Payer: Self-pay | Admitting: Family Medicine

## 2014-03-13 NOTE — Telephone Encounter (Signed)
Please call patient and let her note the best way to treat the sun spots on her arms is to do cryotherapy which is a little light freezing. That can be done here in the office but she would need to make a separate appointment for that.

## 2014-03-13 NOTE — Telephone Encounter (Signed)
Patient advised and scheduled.  

## 2014-03-19 ENCOUNTER — Other Ambulatory Visit: Payer: Self-pay | Admitting: *Deleted

## 2014-03-19 DIAGNOSIS — R1310 Dysphagia, unspecified: Secondary | ICD-10-CM

## 2014-04-01 ENCOUNTER — Encounter: Payer: Self-pay | Admitting: Family Medicine

## 2014-04-01 ENCOUNTER — Ambulatory Visit (INDEPENDENT_AMBULATORY_CARE_PROVIDER_SITE_OTHER): Payer: 59 | Admitting: Family Medicine

## 2014-04-01 VITALS — BP 112/64 | HR 82 | Wt 129.0 lb

## 2014-04-01 DIAGNOSIS — L570 Actinic keratosis: Secondary | ICD-10-CM

## 2014-04-01 DIAGNOSIS — L821 Other seborrheic keratosis: Secondary | ICD-10-CM

## 2014-04-01 DIAGNOSIS — L814 Other melanin hyperpigmentation: Secondary | ICD-10-CM

## 2014-04-01 NOTE — Progress Notes (Signed)
   Subjective:    Patient ID: Evelyn Greer, female    DOB: 12-30-1933, 80 y.o.   MRN: [id number]  HPI  She comes in today complaining of multiple spots showing up on her upper chest and arms over the last couple of years. Do not bother her as far as being painful or itchy.  Review of Systems     Objective:   Physical Exam   She has diffuse seborrheic keratoses on her upper chest abdomen and a few on her back.     Assessment & Plan:  Seborrheic keratoses-discussed treatment is cryotherapy. Patient tolerated well. Please see note below.  Cryotherapy Procedure Note  Pre-operative Diagnosis: Seborrheic keratoses , actinic keratosis  Post-operative Diagnosis: Seborrheic keratoses, actinic keratosis  Locations: The seborrheic keratoses are mostly on her upper chest and abdomen and a few on her back. And the actinic keratoses are one on the left forearm and one on the right upper chest.  Indications: Irritation  Anesthesia: not required    Procedure Details  Patient informed of risks (permanent scarring, infection, light or dark discoloration, bleeding, infection, weakness, numbness and recurrence of the lesion) and benefits of the procedure and verbal informed consent obtained.  The areas are treated with liquid nitrogen therapy, frozen until ice ball extended 1 mm beyond lesion, allowed to thaw, and treated again. The patient tolerated procedure well.  The patient was instructed on post-op care, warned that there may be blister formation, redness and pain. Recommend OTC analgesia as needed for pain.  Condition: Stable  Complications: none.  Plan: 1. Instructed to keep the area dry and covered for 24-48h and clean thereafter. 2. Warning signs of infection were reviewed.   3. Recommended that the patient use OTC acetaminophen as needed for pain.  4. Return as needed

## 2014-09-09 ENCOUNTER — Ambulatory Visit (INDEPENDENT_AMBULATORY_CARE_PROVIDER_SITE_OTHER): Payer: Medicare PPO | Admitting: Family Medicine

## 2014-09-09 ENCOUNTER — Encounter: Payer: Self-pay | Admitting: Family Medicine

## 2014-09-09 VITALS — BP 126/67 | HR 74 | Ht 64.0 in | Wt 122.0 lb

## 2014-09-09 DIAGNOSIS — Z23 Encounter for immunization: Secondary | ICD-10-CM

## 2014-09-09 DIAGNOSIS — Z13220 Encounter for screening for lipoid disorders: Secondary | ICD-10-CM

## 2014-09-09 DIAGNOSIS — Z0000 Encounter for general adult medical examination without abnormal findings: Secondary | ICD-10-CM

## 2014-09-09 DIAGNOSIS — M8580 Other specified disorders of bone density and structure, unspecified site: Secondary | ICD-10-CM

## 2014-09-09 DIAGNOSIS — E785 Hyperlipidemia, unspecified: Secondary | ICD-10-CM

## 2014-09-09 DIAGNOSIS — E041 Nontoxic single thyroid nodule: Secondary | ICD-10-CM

## 2014-09-09 LAB — LIPID PANEL
CHOLESTEROL: 235 mg/dL — AB (ref 0–200)
HDL: 62 mg/dL (ref >39)
LDL CALC: 153 mg/dL — AB (ref 0–99)
Total CHOL/HDL Ratio: 3.8 Ratio
Triglycerides: 102 mg/dL (ref <150)
VLDL: 20 mg/dL (ref 0–40)

## 2014-09-09 LAB — COMPLETE METABOLIC PANEL WITH GFR
ALBUMIN: 4 g/dL (ref 3.5–5.2)
ALT: 17 U/L (ref 0–35)
AST: 24 U/L (ref 0–37)
Alkaline Phosphatase: 64 U/L (ref 39–117)
BUN: 11 mg/dL (ref 6–23)
CALCIUM: 9.5 mg/dL (ref 8.4–10.5)
CHLORIDE: 106 meq/L (ref 96–112)
CO2: 26 meq/L (ref 19–32)
Creat: 0.77 mg/dL (ref 0.50–1.10)
GFR, EST AFRICAN AMERICAN: 86 mL/min
GFR, Est Non African American: 74 mL/min
Glucose, Bld: 94 mg/dL (ref 70–99)
POTASSIUM: 3.8 meq/L (ref 3.5–5.3)
SODIUM: 141 meq/L (ref 135–145)
TOTAL PROTEIN: 7 g/dL (ref 6.0–8.3)
Total Bilirubin: 0.5 mg/dL (ref 0.2–1.2)

## 2014-09-09 LAB — TSH: TSH: 0.083 u[IU]/mL — AB (ref 0.350–4.500)

## 2014-09-09 NOTE — Progress Notes (Signed)
Subjective:    Evelyn Greer is a 81 y.o. female who presents for Medicare Annual/Subsequent preventive examination.  Preventive Screening-Counseling & Management  Tobacco History  Smoking status  . Never Smoker   Smokeless tobacco  . Not on file     Problems Prior to Visit 1.   Current Problems (verified) Patient Active Problem List   Diagnosis Date Noted  . OSTEOPENIA 01/08/2010  . CARDIAC ARRHYTHMIA 11/27/2009  . ROSACEA 11/27/2009  . POSTMENOPAUSAL STATUS 11/27/2009  . HYPERLIPIDEMIA 09/27/2009  . Nontoxic uninodular goiter 09/26/2009  . ELEVATED BLOOD PRESSURE 09/26/2009    Medications Prior to Visit Current Outpatient Prescriptions on File Prior to Visit  Medication Sig Dispense Refill  . b complex vitamins tablet Take 1 tablet by mouth daily.    . Multiple Minerals-Vitamins (CALCIUM & VIT D3 BONE HEALTH PO) Take by mouth.    . vitamin C (ASCORBIC ACID) 500 MG tablet Take 500 mg by mouth daily.     No current facility-administered medications on file prior to visit.    Current Medications (verified) Current Outpatient Prescriptions  Medication Sig Dispense Refill  . b complex vitamins tablet Take 1 tablet by mouth daily.    . Multiple Minerals-Vitamins (CALCIUM & VIT D3 BONE HEALTH PO) Take by mouth.    . vitamin C (ASCORBIC ACID) 500 MG tablet Take 500 mg by mouth daily.     No current facility-administered medications for this visit.     Allergies (verified) Atorvastatin   PAST HISTORY  Family History Family History  Problem Relation Age of Onset  . Heart disease Mother 81    CHF  . Heart disease Father 51    heart attack  . Cancer Sister     premenopausal breast cancer  . Depression Brother 52    committed suicide     Social History History  Substance Use Topics  . Smoking status: Never Smoker   . Smokeless tobacco: Not on file  . Alcohol Use: No     Are there smokers in your home (other than you)? No  Risk Factors Current  exercise habits: walks for exercise  Dietary issues discussed: None   Cardiac risk factors: advanced age (older than 53 for men, 75 for women), dyslipidemia and hypertension.  Depression Screen (Note: if answer to either of the following is "Yes", a more complete depression screening is indicated)   Over the past two weeks, have you felt down, depressed or hopeless? No  Over the past two weeks, have you felt little interest or pleasure in doing things? Yes  Have you lost interest or pleasure in daily life? No  Do you often feel hopeless? No  Do you cry easily over simple problems? No  Activities of Daily Living In your present state of health, do you have any difficulty performing the following activities?:  Driving? No Managing money?  No Feeding yourself? No Getting from bed to chair? No  Climbing a flight of stairs? No Preparing food and eating?: No Bathing or showering? No Getting dressed: No Getting to the toilet? No Using the toilet:No Moving around from place to place: No In the past year have you fallen or had a near fall?:No   Are you sexually active?  No  Do you have more than one partner?  No  Hearing Difficulties: Yes Do you often ask people to speak up or repeat themselves? Yes Do you experience ringing or noises in your ears? No Do you have difficulty understanding soft  or whispered voices? No   Do you feel that you have a problem with memory? No  Do you often misplace items? No  Do you feel safe at home?  Yes  Cognitive Testing  Alert? Yes  Normal Appearance?Yes  Oriented to person? Yes  Place? Yes   Time? Yes  Recall of three objects?  Yes  Can perform simple calculations? Yes  Displays appropriate judgment?Yes  Can read the correct time from a watch face?Yes   Advanced Directives have been discussed with the patient? Yes   6 CIT SCORE of 12/26 - Significant.   List the Names of Other Physician/Practitioners you currently use: 1.    Indicate  any recent Medical Services you may have received from other than [facility] providers in the past year (date may be approximate).  Immunization History  Administered Date(s) Administered  . Pneumococcal Polysaccharide-23 01/05/2010  . Tdap 02/22/2011    Screening Tests Health Maintenance  Topic Date Due  . ZOSTAVAX  10/10/1995  . INFLUENZA VACCINE  03/16/2014  . COLONOSCOPY  01/21/2020  . TETANUS/TDAP  02/21/2021  . DEXA SCAN  Completed  . PNEUMOCOCCAL POLYSACCHARIDE VACCINE AGE 58 AND OVER  Completed    All answers were reviewed with the patient and necessary referrals were made:  Juwon Scripter, MD   09/09/2014   History reviewed: allergies, current medications, past family history, past medical history, past social history, past surgical history and problem list  Review of Systems A comprehensive review of systems was negative.    Objective:     Vision by Snellen chart: eye exam is UTD.    Body mass index is 20.93 kg/(m^2). BP 126/67 mmHg  Pulse 74  Ht 5\' 4"  (1.626 m)  Wt 122 lb (55.339 kg)  BMI 20.93 kg/m2  SpO2 99%  BP 126/67 mmHg  Pulse 74  Ht 5\' 4"  (1.626 m)  Wt 122 lb (55.339 kg)  BMI 20.93 kg/m2  SpO2 99% General appearance: alert, cooperative and appears stated age Head: Normocephalic, without obvious abnormality, atraumatic Eyes: conj clear, EOMi, PEERLA Ears: normal TM's and external ear canals both ears Nose: Nares normal. Septum midline. Mucosa normal. No drainage or sinus tenderness. Throat: lips, mucosa, and tongue normal; teeth and gums normal Neck: no adenopathy, no carotid bruit, no JVD, supple, symmetrical, trachea midline and thyroid not enlarged, symmetric, no tenderness/mass/nodules Back: symmetric, no curvature. ROM normal. No CVA tenderness. Lungs: clear to auscultation bilaterally Breasts: normal appearance, no masses or tenderness Heart: regular rate and rhythm, S1, S2 normal, no murmur, click, rub or gallop Abdomen: soft, non-tender;  bowel sounds normal; no masses,  no organomegaly Extremities: extremities normal, atraumatic, no cyanosis or edema Pulses: 2+ and symmetric Skin: Skin color, texture, turgor normal. No rashes or lesions Lymph nodes: Cervical, supraclavicular, and axillary nodes normal. Neurologic: Alert and oriented X 3, normal strength and tone. Normal symmetric reflexes. Normal coordination and gait     Assessment:     Medicare Annual Wellness Exam       Plan:     During the course of the visit the patient was educated and counseled about appropriate screening and preventive services including:    Pneumococcal vaccine  Influenza vaccine Memory loss-she had significant score on the 6CIT.  We discussed her coming back in doing a fulminant edema status evaluation and discussing memory problems. Her daughter-in-law who is here with her today says that the family has noticed some significant worsening of memory over the last 6 months. She is reading  more repetitive in her conversations and she has been forgetting events and appointments. She also gets the days confused some. She does live alone most the time. They have also noticed that she's not been eating as well.  She's lost about 7 pounds since August. Discussed importance of eating 3 meals a day. Making sure getting adequate protein into diet.  Diet review for nutrition referral? Yes ____  Not Indicated __X_   Patient Instructions (the written plan) was given to the patient.  Medicare Attestation I have personally reviewed: The patient's medical and social history Their use of alcohol, tobacco or illicit drugs Their current medications and supplements The patient's functional ability including ADLs,fall risks, home safety risks, cognitive, and hearing and visual impairment Diet and physical activities Evidence for depression or mood disorders  The patient's weight, height, BMI, and visual acuity have been recorded in the chart.  I have made  referrals, counseling, and provided education to the patient based on review of the above and I have provided the patient with a written personalized care plan for preventive services.     Ramie Erman, MD   09/09/2014

## 2014-09-09 NOTE — Addendum Note (Signed)
Addended by: Teddy Spike on: 09/09/2014 11:59 AM   Modules accepted: Orders

## 2014-09-09 NOTE — Patient Instructions (Signed)
complete physical examination Keep up a regular exercise program and make sure you are eating a healthy diet Try to eat 4 servings of dairy a day, or if you are lactose intolerant take a calcium with vitamin D daily.  Your vaccines are up to date.   

## 2014-09-10 ENCOUNTER — Other Ambulatory Visit: Payer: Self-pay | Admitting: *Deleted

## 2014-09-10 DIAGNOSIS — E041 Nontoxic single thyroid nodule: Secondary | ICD-10-CM

## 2014-09-10 LAB — VITAMIN D 25 HYDROXY (VIT D DEFICIENCY, FRACTURES): VIT D 25 HYDROXY: 34 ng/mL (ref 30–100)

## 2014-09-16 ENCOUNTER — Telehealth: Payer: Self-pay | Admitting: Family Medicine

## 2014-09-16 NOTE — Telephone Encounter (Signed)
Evelyn Houseman, Ms. Ledbetters daughter stopped in to bring her new insurance card and was wanting to know if you could mail [employer] lab results from her last appt to her.

## 2014-09-16 NOTE — Telephone Encounter (Signed)
Results printed and mailed.Evelyn Greer [place]

## 2014-10-10 ENCOUNTER — Ambulatory Visit (INDEPENDENT_AMBULATORY_CARE_PROVIDER_SITE_OTHER): Payer: Medicare PPO | Admitting: Family Medicine

## 2014-10-10 ENCOUNTER — Encounter: Payer: Self-pay | Admitting: Family Medicine

## 2014-10-10 VITALS — BP 134/77 | HR 64 | Wt 122.0 lb

## 2014-10-10 DIAGNOSIS — R413 Other amnesia: Secondary | ICD-10-CM

## 2014-10-10 NOTE — Progress Notes (Signed)
   Subjective:    Patient ID: Evelyn Greer, female    DOB: 1934/06/04, 81 y.o.   MRN: [id number]  HPI Here for momory testing. She has noticed a hard time with dates. Family members have reported that she repeats herself with stories.  Feels like her mood is good.  No hx of HTN.  She has hyperlipidemia that is not treated with a statin. No family hx of alzheimer ro dementia. She is not on any prescription medications but she does take multiple supplements including a thyroid supplement. Fact we called her and asked her to cut in half since her TSH was a little suppressed in January. Her daughter-in-law is here with her today.  Review of Systems     Objective:   Physical Exam  Constitutional: She appears well-developed and well-nourished. No distress.  Well groomed.   Neurological: She is alert.  Skin: Skin is warm and dry.  Psychiatric: She has a normal mood and affect. Her behavior is normal. Judgment and thought content normal.          Assessment & Plan:  Memory changes/decline - Mini-Mental score of 24 out of 30. Passing is 27 based on her age and level of education. She lost points for copying the pentagons and writing a sons with proper grammar, and points for orientation. We discussed doing some additional blood work just check for syphilis, B12 deficiency etc. We will recheck her thyroid as well since it was suppressed at last office visit in January. Also consider scanning her brain to see if there are any signs of microvascular changes to indicate vascular dementia. She does have significantly elevated cholesterol levels which would certainly put her at risk. No history of hypertension or family history of dementia. She does not take any prescription medications and has had no recent trauma or events. No recent infections to cause the changes.  Recheck MMSE in 6 months.   Time spent 25 min, > 50% spent counseling about Memory changes.

## 2014-10-11 LAB — CBC WITH DIFFERENTIAL/PLATELET
Basophils Absolute: 0 10*3/uL (ref 0.0–0.1)
Basophils Relative: 0 % (ref 0–1)
Eosinophils Absolute: 0.1 10*3/uL (ref 0.0–0.7)
Eosinophils Relative: 1 % (ref 0–5)
HEMATOCRIT: 40.7 % (ref 36.0–46.0)
HEMOGLOBIN: 13.6 g/dL (ref 12.0–15.0)
LYMPHS ABS: 2 10*3/uL (ref 0.7–4.0)
LYMPHS PCT: 29 % (ref 12–46)
MCH: 28 pg (ref 26.0–34.0)
MCHC: 33.4 g/dL (ref 30.0–36.0)
MCV: 83.9 fL (ref 78.0–100.0)
MONOS PCT: 6 % (ref 3–12)
MPV: 10.8 fL (ref 8.6–12.4)
Monocytes Absolute: 0.4 10*3/uL (ref 0.1–1.0)
Neutro Abs: 4.5 10*3/uL (ref 1.7–7.7)
Neutrophils Relative %: 64 % (ref 43–77)
Platelets: 193 10*3/uL (ref 150–400)
RBC: 4.85 MIL/uL (ref 3.87–5.11)
RDW: 15.6 % — AB (ref 11.5–15.5)
WBC: 7 10*3/uL (ref 4.0–10.5)

## 2014-10-11 LAB — TSH: TSH: 4.908 u[IU]/mL — ABNORMAL HIGH (ref 0.350–4.500)

## 2014-10-11 LAB — VITAMIN B12: Vitamin B-12: 810 pg/mL (ref 211–911)

## 2014-10-11 LAB — MAGNESIUM: MAGNESIUM: 2.4 mg/dL (ref 1.5–2.5)

## 2014-10-11 LAB — RPR

## 2014-11-18 ENCOUNTER — Encounter: Payer: Self-pay | Admitting: Family Medicine

## 2014-11-18 ENCOUNTER — Ambulatory Visit (INDEPENDENT_AMBULATORY_CARE_PROVIDER_SITE_OTHER): Payer: Medicare PPO | Admitting: Family Medicine

## 2014-11-18 ENCOUNTER — Ambulatory Visit (INDEPENDENT_AMBULATORY_CARE_PROVIDER_SITE_OTHER): Payer: Medicare PPO

## 2014-11-18 VITALS — BP 146/88 | HR 75 | Temp 98.2°F | Resp 18 | Ht 64.0 in | Wt 124.0 lb

## 2014-11-18 DIAGNOSIS — I6523 Occlusion and stenosis of bilateral carotid arteries: Secondary | ICD-10-CM | POA: Diagnosis not present

## 2014-11-18 DIAGNOSIS — S161XXA Strain of muscle, fascia and tendon at neck level, initial encounter: Secondary | ICD-10-CM | POA: Diagnosis not present

## 2014-11-18 DIAGNOSIS — M4312 Spondylolisthesis, cervical region: Secondary | ICD-10-CM

## 2014-11-18 DIAGNOSIS — M8588 Other specified disorders of bone density and structure, other site: Secondary | ICD-10-CM | POA: Diagnosis not present

## 2014-11-18 DIAGNOSIS — I6529 Occlusion and stenosis of unspecified carotid artery: Secondary | ICD-10-CM | POA: Insufficient documentation

## 2014-11-18 MED ORDER — CLONAZEPAM 0.5 MG PO TABS: 0.5000 mg | ORAL_TABLET | Freq: Every day | ORAL | Status: DC | PRN

## 2014-11-18 MED ORDER — TIZANIDINE HCL 2 MG PO TABS: 2.0000 mg | ORAL_TABLET | Freq: Every evening | ORAL | Status: DC | PRN
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
## 2014-11-18 NOTE — Patient Instructions (Signed)
Aleve - one in the morning and one at bedtime for about a week.  Take with some food and water.      Cervical Sprain A cervical sprain is an injury in the neck in which the strong, fibrous tissues (ligaments) that connect your neck bones stretch or tear. Cervical sprains can range from mild to severe. Severe cervical sprains can cause the neck vertebrae to be unstable. This can lead to damage of the spinal cord and can result in serious nervous system problems. The amount of time it takes for a cervical sprain to get better depends on the cause and extent of the injury. Most cervical sprains heal in 1 to 3 weeks. CAUSES  Severe cervical sprains may be caused by:   Contact sport injuries (such as from football, rugby, wrestling, hockey, auto racing, gymnastics, diving, martial arts, or boxing).   Motor vehicle collisions.   Whiplash injuries. This is an injury from a sudden forward and backward whipping movement of the head and neck.  Falls.  Mild cervical sprains may be caused by:   Being in an awkward position, such as while cradling a telephone between your ear and shoulder.   Sitting in a chair that does not offer proper support.   Working at a poorly Landscape architect station.   Looking up or down for long periods of time.  SYMPTOMS   Pain, soreness, stiffness, or a burning sensation in the front, back, or sides of the neck. This discomfort may develop immediately after the injury or slowly, 24 hours or more after the injury.   Pain or tenderness directly in the middle of the back of the neck.   Shoulder or upper back pain.   Limited ability to move the neck.   Headache.   Dizziness.   Weakness, numbness, or tingling in the hands or arms.   Muscle spasms.   Difficulty swallowing or chewing.   Tenderness and swelling of the neck.  DIAGNOSIS  Most of the time your health care provider can diagnose a cervical sprain by taking your history and doing a  physical exam. Your health care provider will ask about previous neck injuries and any known neck problems, such as arthritis in the neck. X-rays may be taken to find out if there are any other problems, such as with the bones of the neck. Other tests, such as a CT scan or MRI, may also be needed.  TREATMENT  Treatment depends on the severity of the cervical sprain. Mild sprains can be treated with rest, keeping the neck in place (immobilization), and pain medicines. Severe cervical sprains are immediately immobilized. Further treatment is done to help with pain, muscle spasms, and other symptoms and may include:  Medicines, such as pain relievers, numbing medicines, or muscle relaxants.   Physical therapy. This may involve stretching exercises, strengthening exercises, and posture training. Exercises and improved posture can help stabilize the neck, strengthen muscles, and help stop symptoms from returning.  HOME CARE INSTRUCTIONS   Put ice on the injured area.   Put ice in a plastic bag.   Place a towel between your skin and the bag.   Leave the ice on for 15-20 minutes, 3-4 times a day.   If your injury was severe, you may have been given a cervical collar to wear. A cervical collar is a two-piece collar designed to keep your neck from moving while it heals.  Do not remove the collar unless instructed by your health care provider.  If you have long hair, keep it outside of the collar.  Ask your health care provider before making any adjustments to your collar. Minor adjustments may be required over time to improve comfort and reduce pressure on your chin or on the back of your head.  Ifyou are allowed to remove the collar for cleaning or bathing, follow your health care provider's instructions on how to do so safely.  Keep your collar clean by wiping it with mild soap and water and drying it completely. If the collar you have been given includes removable pads, remove them every  1-2 days and hand wash them with soap and water. Allow them to air dry. They should be completely dry before you wear them in the collar.  If you are allowed to remove the collar for cleaning and bathing, wash and dry the skin of your neck. Check your skin for irritation or sores. If you see any, tell your health care provider.  Do not drive while wearing the collar.   Only take over-the-counter or prescription medicines for pain, discomfort, or fever as directed by your health care provider.   Keep all follow-up appointments as directed by your health care provider.   Keep all physical therapy appointments as directed by your health care provider.   Make any needed adjustments to your workstation to promote good posture.   Avoid positions and activities that make your symptoms worse.   Warm up and stretch before being active to help prevent problems.  SEEK MEDICAL CARE IF:   Your pain is not controlled with medicine.   You are unable to decrease your pain medicine over time as planned.   Your activity level is not improving as expected.  SEEK IMMEDIATE MEDICAL CARE IF:   You develop any bleeding.  You develop stomach upset.  You have signs of an allergic reaction to your medicine.   Your symptoms get worse.   You develop new, unexplained symptoms.   You have numbness, tingling, weakness, or paralysis in any part of your body.  MAKE SURE YOU:   Understand these instructions.  Will watch your condition.  Will get help right away if you are not doing well or get worse. Document Released: 05/30/2007 Document Revised: 08/07/2013 Document Reviewed: 02/07/2013 [facility] Patient Information 2015 [place], [place]. This information is not intended to replace advice given to you by your health care provider. Make sure you discuss any questions you have with your health care provider.

## 2015-04-10 ENCOUNTER — Ambulatory Visit: Payer: Medicare PPO | Admitting: Family Medicine

## 2015-05-01 ENCOUNTER — Ambulatory Visit (INDEPENDENT_AMBULATORY_CARE_PROVIDER_SITE_OTHER): Payer: Medicare PPO | Admitting: Family Medicine

## 2015-05-01 ENCOUNTER — Encounter: Payer: Self-pay | Admitting: Family Medicine

## 2015-05-01 VITALS — BP 156/76 | HR 65 | Ht 64.0 in | Wt 131.0 lb

## 2015-05-01 DIAGNOSIS — R413 Other amnesia: Secondary | ICD-10-CM | POA: Diagnosis not present

## 2015-05-01 DIAGNOSIS — R7989 Other specified abnormal findings of blood chemistry: Secondary | ICD-10-CM

## 2015-05-01 NOTE — Progress Notes (Signed)
   Subjective:    Patient ID: Evelyn Greer, female    DOB: 1933/12/28, 81 y.o.   MRN: [id number]  HPI Six-month follow-up for memory evaluation. When I last saw her score was 24 out of 30. 27 was considered a passing score based on her age and level of education. He did some additional blood work at that time just to rule out B12 deficiency syphilis etc. No history of hypertension and a family history of dementia. And there was no recent trauma. All for blood work looks good except her TSH was mildly elevated at 4.9. She has gained sme weight and gets cold easily.  Review of Systems     Objective:   Physical Exam  Constitutional: She is oriented to person, place, and time. She appears well-developed and well-nourished.  HENT:  Head: Normocephalic and atraumatic.  Eyes: Conjunctivae and EOM are normal.  Cardiovascular: Normal rate.   Pulmonary/Chest: Effort normal.  Neurological: She is alert and oriented to person, place, and time.  Skin: Skin is dry. No pallor.  Psychiatric: She has a normal mood and affect. Her behavior is normal.          Assessment & Plan:  Memory loss-she has had a decline since the last office visit. Mini-Mental status score of 18 today, previous was 24. And again passing or for her age and level of education is 36. She mostly missed point for orientation attention and calculation with spelling world backwards and recall of items. 1 start with rechecking her thyroid which was borderline elevated 6 months ago. If it is abnormal then we will address them treated. If it's normal then we can consider moving forward with an head CT for further evaluation. Also one heading discussed starting a memory medication such as Aricept. Discussed the pros and cons and potential side effects of the medication. I gave an additional handout to look over and to think about it and discuss it as a family.  Elevated TSH-we'll recheck thyroid level along with free T4 today.  Time  spent 30 spent, 50% spent counseling about memory loss and abnormal thyroid.

## 2015-05-01 NOTE — Patient Instructions (Signed)
Donepezil tablets What is this medicine? DONEPEZIL (doe NEP e zil) is used to treat mild to moderate dementia caused by Alzheimer's disease. This medicine may be used for other purposes; ask your health care provider or pharmacist if you have questions. COMMON BRAND NAME(S): Aricept What should I tell my health care provider before I take this medicine? They need to know if you have any of these conditions: -asthma or other lung disease -difficulty passing urine -head injury -heart disease -history of irregular heartbeat -liver disease -seizures (convulsions) -stomach or intestinal disease, ulcers or stomach bleeding -an unusual or allergic reaction to donepezil, other medicines, foods, dyes, or preservatives -pregnant or trying to get pregnant -breast-feeding How should I use this medicine? Take this medicine by mouth with a glass of water. Follow the directions on the prescription label. You may take this medicine with or without food. Take this medicine at regular intervals. This medicine is usually taken before bedtime. Do not take it more often than directed. Continue to take your medicine even if you feel better. Do not stop taking except on your doctor's advice. If you are taking the 23 mg donepezil tablet, swallow it whole; do not cut, crush, or chew it. Talk to your pediatrician regarding the use of this medicine in children. Special care may be needed. Overdosage: If you think you have taken too much of this medicine contact a poison control center or emergency room at once. NOTE: This medicine is only for you. Do not share this medicine with others. What if I miss a dose? If you miss a dose, take it as soon as you can. If it is almost time for your next dose, take only that dose, do not take double or extra doses. What may interact with this medicine? Do not take this medicine with any of the following medications: -certain medicines for fungal infections like itraconazole,  fluconazole, posaconazole, and voriconazole -cisapride -dextromethorphan; quinidine -dofetilide -dronedarone -pimozide -quinidine -thioridazine -ziprasidone This medicine may also interact with the following medications: -antihistamines for allergy, cough and cold -atropine -bethanechol -carbamazepine -certain medicines for bladder problems like oxybutynin, tolterodine -certain medicines for Parkinson's disease like benztropine, trihexyphenidyl -certain medicines for stomach problems like dicyclomine, hyoscyamine -certain medicines for travel sickness like scopolamine -dexamethasone -ipratropium -NSAIDs, medicines for pain and inflammation, like ibuprofen or naproxen -other medicines for Alzheimer's disease -other medicines that prolong the QT interval (cause an abnormal heart rhythm) -phenobarbital -phenytoin -rifampin, rifabutin or rifapentine This list may not describe all possible interactions. Give your health care provider a list of all the medicines, herbs, non-prescription drugs, or dietary supplements you use. Also tell them if you smoke, drink alcohol, or use illegal drugs. Some items may interact with your medicine. What should I watch for while using this medicine? Visit your doctor or health care professional for regular checks on your progress. Check with your doctor or health care professional if your symptoms do not get better or if they get worse. You may get drowsy or dizzy. Do not drive, use machinery, or do anything that needs mental alertness until you know how this drug affects you. What side effects may I notice from receiving this medicine? Side effects that you should report to your doctor or health care professional as soon as possible: -allergic reactions like skin rash, itching or hives, swelling of the face, lips, or tongue -changes in vision -feeling faint or lightheaded, falls -problems with balance -slow heartbeat, or palpitations -stomach  pain -unusual bleeding or bruising,  red or purple spots on the skin -vomiting -weight loss Side effects that usually do not require medical attention (report to your doctor or health care professional if they continue or are bothersome): -diarrhea, especially when starting treatment -headache -indigestion or heartburn -loss of appetite -muscle cramps -nausea This list may not describe all possible side effects. Call your doctor for medical advice about side effects. You may report side effects to FDA at 1-800-FDA-1088. Where should I keep my medicine? Keep out of reach of children. Store at room temperature between 15 and 30 degrees C (59 and 86 degrees F). Throw away any unused medicine after the expiration date. NOTE: This sheet is a summary. It may not cover all possible information. If you have questions about this medicine, talk to your doctor, pharmacist, or health care provider.  2015, [employer]. (2013-11-19 21:44:11)

## 2015-05-02 LAB — T4, FREE: FREE T4: 1.09 ng/dL (ref 0.80–1.80)

## 2015-05-02 LAB — TSH: TSH: 2.174 u[IU]/mL (ref 0.350–4.500)

## 2015-05-02 LAB — T3, FREE: T3, Free: 2.7 pg/mL (ref 2.3–4.2)

## 2015-05-22 ENCOUNTER — Ambulatory Visit (INDEPENDENT_AMBULATORY_CARE_PROVIDER_SITE_OTHER): Payer: Medicare PPO | Admitting: Family Medicine

## 2015-05-22 ENCOUNTER — Encounter: Payer: Self-pay | Admitting: Family Medicine

## 2015-05-22 VITALS — BP 144/73 | HR 72 | Wt 129.0 lb

## 2015-05-22 DIAGNOSIS — H1013 Acute atopic conjunctivitis, bilateral: Secondary | ICD-10-CM

## 2015-05-22 DIAGNOSIS — L570 Actinic keratosis: Secondary | ICD-10-CM | POA: Diagnosis not present

## 2015-05-22 NOTE — Patient Instructions (Signed)
Zaditor allegy eye drops work great for allergic eye symptoms.

## 2015-05-22 NOTE — Progress Notes (Signed)
   Subjective:    Patient ID: Evelyn Greer, female    DOB: February 12, 1934, 81 y.o.   MRN: [id number]  HPI C/O of red spot on her right facial cheek.  She says it is  Not bothersome. No itching or irritation. She just notices been there for several weeks. No prior history of skin cancer but she does have a history of significance on exposure.  Allergic eye symptoms - says they are watering and irritated. Has been using some OTC drops.    Review of Systems     Objective:   Physical Exam  Constitutional: She appears well-developed and well-nourished.  HENT:  Head: Normocephalic and atraumatic.  Eyes: Conjunctivae and EOM are normal. Pupils are equal, round, and reactive to light. Right eye exhibits no discharge. Left eye exhibits no discharge.  Right eye with clear watery drainage.  Skin: Skin is warm and dry. Rash noted.  She does have a round approximately 1 cm area on the right facial cheek. The border does fade. He does have some slight dry palpable crusting over the surface most consistent with an actinic keratosis.  Psychiatric: She has a normal mood and affect. Her behavior is normal.          Assessment & Plan:  AK- treatment options discussed. Recommended cryotherapy. Patient tolerated procedure well.  Allergic eye - recommend try over-the-counter Zaditor. Call if not improving.  Cryotherapy Procedure Note  Pre-operative Diagnosis: Actinic keratosis  Post-operative Diagnosis: Actinic keratosis  Locations: right facial cheek  Indications: pre- cancerous lesion   Anesthesia: not required    Procedure Details.   Patient informed of risks (permanent scarring, infection, light or dark discoloration, bleeding, infection, and recurrence of the lesion) and benefits of the procedure and verbal informed consent obtained.  The areas are treated with liquid nitrogen therapy, frozen until ice ball extended 1-2 mm beyond lesion, allowed to thaw, and treated again. The patient  tolerated procedure well.  The patient was instructed on post-op care, warned that there may be blister formation, redness and pain. Recommend OTC analgesia as needed for pain.  Condition: Stable  Complications: none.  Plan: 1. Instructed to keep the area dry and covered for 24-48h and clean thereafter. 2. Warning signs of infection were reviewed.   3. Recommended that the patient use OTC acetaminophen as needed for pain.  4. Return PRN

## 2015-07-21 ENCOUNTER — Ambulatory Visit (INDEPENDENT_AMBULATORY_CARE_PROVIDER_SITE_OTHER): Payer: Medicare PPO | Admitting: Osteopathic Medicine

## 2015-07-21 ENCOUNTER — Encounter: Payer: Self-pay | Admitting: Osteopathic Medicine

## 2015-07-21 ENCOUNTER — Ambulatory Visit (INDEPENDENT_AMBULATORY_CARE_PROVIDER_SITE_OTHER): Payer: Medicare PPO

## 2015-07-21 VITALS — BP 157/87 | HR 61 | Wt 129.0 lb

## 2015-07-21 DIAGNOSIS — M25512 Pain in left shoulder: Secondary | ICD-10-CM | POA: Diagnosis not present

## 2015-07-21 DIAGNOSIS — R079 Chest pain, unspecified: Secondary | ICD-10-CM

## 2015-07-21 DIAGNOSIS — M79602 Pain in left arm: Secondary | ICD-10-CM

## 2015-07-21 MED ORDER — NAPROXEN 250 MG PO TABS: 250.0000 mg | ORAL_TABLET | Freq: Three times a day (TID) | ORAL | Status: DC

## 2015-07-21 MED ORDER — METHYLPREDNISOLONE 4 MG PO TBPK: ORAL_TABLET | ORAL | Status: DC

## 2015-07-21 NOTE — Progress Notes (Signed)
HPI: Evelyn Greer is a 81 y.o. female who presents to [facility]  today for chief complaint of: No chief complaint on file.   . Location: L arm and chest . Quality: "hurts"  . Severity: moderate . Duration: 2 or 3 days, daughter notes some complaining about it 2 weeks ago  . Context: no injury . Modifying factors: worse when laying down, lifting makes it worse . Assoc signs/symptoms: no weakness, no loss grip strength, no SOB, no dizziness   Past medical, social and family history reviewed: Past Medical History  Diagnosis Date  . Hypertension   . Hyperlipidemia   . Multiple thyroid nodules     nodules 2009 last [place]  . Hearing loss     sensorineural   Past Surgical History  Procedure Laterality Date  . Abdominal hysterectomy      TAH w/o oophrectomy for fibroids   Social History  Substance Use Topics  . Smoking status: Never Smoker   . Smokeless tobacco: Not on file  . Alcohol Use: No   Family History  Problem Relation Age of Onset  . Heart disease Mother 82    CHF  . Heart disease Father 61    heart attack  . Cancer Sister     premenopausal breast cancer  . Depression Brother 30    committed suicide     Current Outpatient Prescriptions  Medication Sig Dispense Refill  . b complex vitamins tablet Take 1 tablet by mouth daily.    . Multiple Minerals-Vitamins (CALCIUM & VIT D3 BONE HEALTH PO) Take by mouth.    . vitamin C (ASCORBIC ACID) 500 MG tablet Take 500 mg by mouth daily.    . vitamin E 200 UNIT capsule Take 200 Units by mouth daily.     No current facility-administered medications for this visit.   Allergies  Allergen Reactions  . Atorvastatin     REACTION: Pain in legs      Review of Systems: CONSTITUTIONAL:  No  fever, no chills, No  unintentional weight changes HEAD/EYES/EARS/NOSE/THROAT: No headache, no vision change, CARDIAC: L chest discomfort with arm pain, no pressure/palpitations, no  orthopnea RESPIRATORY: No  cough, No  shortness of breath/wheeze GASTROINTESTINAL: No nausea, no vomiting, no abdominal pain MUSCULOSKELETAL: Yes  Myalgia/arthralgia arm pain as per [facility] NEUROLOGIC: No weakness, no dizziness, no slurred speech   Exam:  BP 157/87 mmHg  Pulse 61  Wt 129 lb (58.514 kg) Constitutional: VSS, see above. General Appearance: alert, well-developed, well-nourished, NAD Eyes: Normal lids and conjunctive, non-icteric sclera,  Respiratory: Normal respiratory effort. no wheeze, no rhonchi, no rales Cardiovascular: S1/S2 normal, no murmur, no rub/gallop auscultated. RRR.   Musculoskeletal: Gait normal. No clubbing/cyanosis of digits.   Shoulder:  Empty Can (Supraspinatus): neg  Drop Arm (Supraspinatus): neg  Liftoff (Subscapularis): Neg  Speeds (Biceps Tendonitis/Labrum): Neg  Hawkins (Impingement/Subacromial Bursitis): Neg  Apprehension (Anterior Instability): Neg Neurological: No cranial nerve deficit on limited exam. Motor and sensation intact and symmetric. 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 Shoulder tests negative as above, most likely musculoskeletal arm/chest wall pain. Patient advised will consider physical therapy if no improvement with conservative management. May also consider sports medicine referral based on x-ray results. Patient does have degenerative  changes of cervical spine based on review of x-ray from earlier this year, Spurling test negative and no numbness.  Left arm pain - Plan: DG Shoulder Left, methylPREDNISolone (MEDROL DOSEPAK) 4 MG TBPK tablet, naproxen (NAPROSYN) 250 MG tablet  Left shoulder pain - Plan: DG Shoulder Left, methylPREDNISolone  (MEDROL DOSEPAK) 4 MG TBPK tablet, naproxen (NAPROSYN) 250 MG tablet  Left sided chest pain   Return if symptoms worsen or fail to improve.

## 2015-07-21 NOTE — Patient Instructions (Addendum)
We'll get x-ray today of the shoulder, we will call you with the results of this once the radiologist has read it. Try the medications as prescribed to help with pain, if the pain persists can talk about physical therapy referral versus making an appointment with one of our sports medicine doctors. If severe chest pain develops, that is a reason to go go the emergency room. EKG today looks ok.

## 2015-07-24 NOTE — Addendum Note (Signed)
Addended by: Huel Cote on: 07/24/2015 04:04 PM   Modules accepted: Orders

## 2015-08-01 ENCOUNTER — Ambulatory Visit: Payer: Medicare PPO | Admitting: Family Medicine

## 2015-08-04 ENCOUNTER — Ambulatory Visit: Payer: Medicare PPO | Admitting: Family Medicine

## 2015-12-15 ENCOUNTER — Encounter: Payer: Self-pay | Admitting: Family Medicine

## 2015-12-15 ENCOUNTER — Ambulatory Visit (INDEPENDENT_AMBULATORY_CARE_PROVIDER_SITE_OTHER): Payer: Medicare PPO | Admitting: Family Medicine

## 2015-12-15 VITALS — BP 139/88 | HR 74 | Wt 128.0 lb

## 2015-12-15 DIAGNOSIS — Z0000 Encounter for general adult medical examination without abnormal findings: Secondary | ICD-10-CM | POA: Diagnosis not present

## 2015-12-15 DIAGNOSIS — Z1231 Encounter for screening mammogram for malignant neoplasm of breast: Secondary | ICD-10-CM | POA: Diagnosis not present

## 2015-12-15 DIAGNOSIS — M8580 Other specified disorders of bone density and structure, unspecified site: Secondary | ICD-10-CM | POA: Diagnosis not present

## 2015-12-15 DIAGNOSIS — R7989 Other specified abnormal findings of blood chemistry: Secondary | ICD-10-CM | POA: Diagnosis not present

## 2015-12-15 LAB — COMPLETE METABOLIC PANEL WITH GFR
ALBUMIN: 4.2 g/dL (ref 3.6–5.1)
ALK PHOS: 50 U/L (ref 33–130)
ALT: 13 U/L (ref 6–29)
AST: 23 U/L (ref 10–35)
BILIRUBIN TOTAL: 0.5 mg/dL (ref 0.2–1.2)
BUN: 14 mg/dL (ref 7–25)
CO2: 26 mmol/L (ref 20–31)
Calcium: 9.5 mg/dL (ref 8.6–10.4)
Chloride: 105 mmol/L (ref 98–110)
Creat: 0.77 mg/dL (ref 0.60–0.88)
GFR, EST AFRICAN AMERICAN: 84 mL/min (ref >=60)
GFR, Est Non African American: 73 mL/min (ref >=60)
GLUCOSE: 85 mg/dL (ref 65–99)
POTASSIUM: 4 mmol/L (ref 3.5–5.3)
SODIUM: 139 mmol/L (ref 135–146)
Total Protein: 7 g/dL (ref 6.1–8.1)

## 2015-12-15 LAB — LIPID PANEL
Cholesterol: 238 mg/dL — ABNORMAL HIGH (ref 125–200)
HDL: 76 mg/dL (ref >=46)
LDL CALC: 147 mg/dL — AB (ref <130)
Total CHOL/HDL Ratio: 3.1 Ratio (ref <=5.0)
Triglycerides: 75 mg/dL (ref <150)
VLDL: 15 mg/dL (ref <30)

## 2015-12-15 LAB — TSH: TSH: 2.66 m[IU]/L

## 2015-12-15 NOTE — Patient Instructions (Addendum)
1. Let us know if you decide you would like a referral for hearing testing 2. Keep up a regular exercise program and make sure you are eating a healthy diet 3. Try to eat 4 servings of dairy a day, or if you are lactose intolerant take a calcium with vitamin D daily.     Donepezil tablets What is this medicine? DONEPEZIL (doe NEP e zil) is used to treat mild to moderate dementia caused by Alzheimer's disease. This medicine may be used for other purposes; ask your health care provider or pharmacist if you have questions. What should I tell my health care provider before I take this medicine? They need to know if you have any of these conditions: -asthma or other lung disease -difficulty passing urine -head injury -heart disease -history of irregular heartbeat -liver disease -seizures (convulsions) -stomach or intestinal disease, ulcers or stomach bleeding -an unusual or allergic reaction to donepezil, other medicines, foods, dyes, or preservatives -pregnant or trying to get pregnant -breast-feeding How should I use this medicine? Take this medicine by mouth with a glass of water. Follow the directions on the prescription label. You may take this medicine with or without food. Take this medicine at regular intervals. This medicine is usually taken before bedtime. Do not take it more often than directed. Continue to take your medicine even if you feel better. Do not stop taking except on your doctor's advice. If you are taking the 23 mg donepezil tablet, swallow it whole; do not cut, crush, or chew it. Talk to your pediatrician regarding the use of this medicine in children. Special care may be needed. Overdosage: If you think you have taken too much of this medicine contact a poison control center or emergency room at once. NOTE: This medicine is only for you. Do not share this medicine with others. What if I miss a dose? If you miss a dose, take it as soon as you can. If it is almost  time for your next dose, take only that dose, do not take double or extra doses. What may interact with this medicine? Do not take this medicine with any of the following medications: -certain medicines for fungal infections like itraconazole, fluconazole, posaconazole, and voriconazole -cisapride -dextromethorphan; quinidine -dofetilide -dronedarone -pimozide -quinidine -thioridazine -ziprasidone This medicine may also interact with the following medications: -antihistamines for allergy, cough and cold -atropine -bethanechol -carbamazepine -certain medicines for bladder problems like oxybutynin, tolterodine -certain medicines for Parkinson's disease like benztropine, trihexyphenidyl -certain medicines for stomach problems like dicyclomine, hyoscyamine -certain medicines for travel sickness like scopolamine -dexamethasone -ipratropium -NSAIDs, medicines for pain and inflammation, like ibuprofen or naproxen -other medicines for Alzheimer's disease -other medicines that prolong the QT interval (cause an abnormal heart rhythm) -phenobarbital -phenytoin -rifampin, rifabutin or rifapentine This list may not describe all possible interactions. Give your health care provider a list of all the medicines, herbs, non-prescription drugs, or dietary supplements you use. Also tell them if you smoke, drink alcohol, or use illegal drugs. Some items may interact with your medicine. What should I watch for while using this medicine? Visit your doctor or health care professional for regular checks on your progress. Check with your doctor or health care professional if your symptoms do not get better or if they get worse. You may get drowsy or dizzy. Do not drive, use machinery, or do anything that needs mental alertness until you know how this drug affects you. What side effects may I notice from receiving this medicine?  Side effects that you should report to your doctor or health care professional  as soon as possible: -allergic reactions like skin rash, itching or hives, swelling of the face, lips, or tongue -changes in vision -feeling faint or lightheaded, falls -problems with balance -redness, blistering, peeling or loosening of the skin, including inside the mouth -slow heartbeat, or palpitations -stomach pain -unusual bleeding or bruising, red or purple spots on the skin -vomiting -weight loss Side effects that usually do not require medical attention (report to your doctor or health care professional if they continue or are bothersome): -diarrhea, especially when starting treatment -headache -indigestion or heartburn -loss of appetite -muscle cramps -nausea This list may not describe all possible side effects. Call your doctor for medical advice about side effects. You may report side effects to FDA at 1-800-FDA-1088. Where should I keep my medicine? Keep out of reach of children. Store at room temperature between 15 and 30 degrees C (59 and 86 degrees F). Throw away any unused medicine after the expiration date. NOTE: This sheet is a summary. It may not cover all possible information. If you have questions about this medicine, talk to your doctor, pharmacist, or health care provider.    2016, [employer] Standard. (2014-03-14 07:51:52)

## 2015-12-15 NOTE — Progress Notes (Signed)
Subjective:    Evelyn Greer is a 82 y.o. female who presents for Medicare Annual/Subsequent preventive examination.  Preventive Screening-Counseling & Management  Tobacco History  Smoking status  . Never Smoker   Smokeless tobacco  . Not on file     Problems Prior to Visit 1. Turning volume on TV.   2. She is still living in her home alone during the daytime but ever since her dog of 13 years passed away a few weeks ago she is actually been going to her son's house and eating dinner and then spending the night there and then goes back to her house in the morning.  Current Problems (verified) Patient Active Problem List   Diagnosis Date Noted  . Mild atherosclerosis of carotid artery 11/18/2014  . OSTEOPENIA 01/08/2010  . CARDIAC ARRHYTHMIA 11/27/2009  . ROSACEA 11/27/2009  . POSTMENOPAUSAL STATUS 11/27/2009  . Hyperlipidemia 09/27/2009  . Nontoxic uninodular goiter 09/26/2009  . ELEVATED BLOOD PRESSURE 09/26/2009    Medications Prior to Visit Current Outpatient Prescriptions on File Prior to Visit  Medication Sig Dispense Refill  . b complex vitamins tablet Take 1 tablet by mouth daily.    . Multiple Minerals-Vitamins (CALCIUM & VIT D3 BONE HEALTH PO) Take by mouth.    . vitamin C (ASCORBIC ACID) 500 MG tablet Take 500 mg by mouth daily.    . vitamin E 200 UNIT capsule Take 200 Units by mouth daily.     No current facility-administered medications on file prior to visit.    Current Medications (verified) Current Outpatient Prescriptions  Medication Sig Dispense Refill  . b complex vitamins tablet Take 1 tablet by mouth daily.    . Calcium Carbonate-Vitamin D (CALCIUM-VITAMIN D) 500-200 MG-UNIT tablet Take 1 tablet by mouth 2 (two) times daily.    . cholecalciferol (VITAMIN D) 1000 units tablet Take 1,000 Units by mouth daily.    . Multiple Minerals-Vitamins (CALCIUM & VIT D3 BONE HEALTH PO) Take by mouth.    . naproxen (NAPROSYN) 250 MG tablet Take by mouth as  needed.    . vitamin C (ASCORBIC ACID) 500 MG tablet Take 500 mg by mouth daily.    . vitamin E 200 UNIT capsule Take 200 Units by mouth daily.     No current facility-administered medications for this visit.     Allergies (verified) Atorvastatin   PAST HISTORY  Family History Family History  Problem Relation Age of Onset  . Heart disease Mother 17    CHF  . Heart disease Father 33    heart attack  . Cancer Sister     premenopausal breast cancer  . Depression Brother 21    committed suicide     Social History Social History  Substance Use Topics  . Smoking status: Never Smoker   . Smokeless tobacco: Not on file  . Alcohol Use: No     Are there smokers in your home (other than you)? No  Risk Factors Current exercise habits: walks approx a mile near her home  Dietary issues discussed: occ drinks Boost if she skips breakfast.    Cardiac risk factors: advanced age (older than 32 for men, 61 for women) and sedentary lifestyle.  Depression Screen (Note: if answer to either of the following is "Yes", a more complete depression screening is indicated)   Over the past two weeks, have you felt down, depressed or hopeless? No  Over the past two weeks, have you felt little interest or pleasure in doing things?  No  Have you lost interest or pleasure in daily life? No  Do you often feel hopeless? No  Do you cry easily over simple problems? No  Activities of Daily Living In your present state of health, do you have any difficulty performing the following activities?:  Driving? No Managing money?  No Feeding yourself? No Getting from bed to chair? No Climbing a flight of stairs? No Preparing food and eating?: No Bathing or showering? No Getting dressed: No Getting to the toilet? No Using the toilet:No Moving around from place to place: No In the past year have you fallen or had a near fall?:No   Are you sexually active?  No  Do you have more than one partner?   No  Hearing Difficulties: No Do you often ask people to speak up or repeat themselves? No Do you experience ringing or noises in your ears? No Do you have difficulty understanding soft or whispered voices? Yes   Do you feel that you have a problem with memory? No  Do you often misplace items? No  Do you feel safe at home?  Yes  Cognitive Testing  Alert? Yes  Normal Appearance?Yes  Oriented to person? Yes  Place? Yes     6CIT score of 19/28.    Advanced Directives have been discussed with the patient? Yes  List the Names of Other Physician/Practitioners you currently use: 1.  None  Indicate any recent Medical Services you may have received from other than [facility] providers in the past year (date may be approximate).  Immunization History  Administered Date(s) Administered  . Influenza,inj,Quad PF,36+ Mos 09/09/2014  . Pneumococcal Conjugate-13 09/09/2014  . Pneumococcal Polysaccharide-23 01/05/2010  . Tdap 02/22/2011    Screening Tests Health Maintenance  Topic Date Due  . ZOSTAVAX  10/10/1995  . INFLUENZA VACCINE  07/21/2016 (Originally 03/16/2016)  . TETANUS/TDAP  02/21/2021  . DEXA SCAN  Completed  . PNA vac Low Risk Adult  Completed    All answers were reviewed with the patient and necessary referrals were made:  Jarod Bozzo, MD   12/15/2015   History reviewed: allergies, current medications, past family history, past medical history, past social history, past surgical history and problem list  Review of Systems A comprehensive review of systems was negative.    Objective:     Vision by Snellen chart: see vision component.  She still hasn't schedule eye exam.   Body mass index is 21.96 kg/(m^2). BP 139/88 mmHg  Pulse 74  Wt 128 lb (58.06 kg)  SpO2 98%  BP 139/88 mmHg  Pulse 74  Wt 128 lb (58.06 kg)  SpO2 98%  General Appearance:    Alert, cooperative, no distress, appears stated age  Head:    Normocephalic, without obvious abnormality, atraumatic   Eyes:    PERRL, conjunctiva/corneas clear, EOM's intact, both eyes  Ears:    Normal TM's and external ear canals, both ears  Nose:   Nares normal, septum midline, mucosa normal, no drainage    or sinus tenderness  Throat:   Lips, mucosa, and tongue normal; teeth and gums normal  Neck:   Supple, symmetrical, trachea midline, no adenopathy;    thyroid:  no enlargement/tenderness/nodules; no carotid   bruit or JVD  Back:     Symmetric, no curvature  Lungs:     Clear to auscultation bilaterally, respirations unlabored  Chest Wall:    No tenderness or deformity   Heart:    Regular rate and rhythm, S1 and S2  normal, no murmur, rub   or gallop  Breast Exam:    No tenderness, masses, or nipple abnormality  Abdomen:     Soft, non-tender, bowel sounds active all four quadrants,    no masses, no organomegaly  Genitalia:    Not performed.   Rectal:    Not performed.   Extremities:   Extremities normal, atraumatic, no cyanosis or edema  Pulses:   2+ and symmetric all extremities  Skin:   Skin color, texture, turgor normal, no rashes or lesions  Lymph nodes:   Cervical, supraclavicular, and axillary nodes normal  Neurologic:   CNII-XII intact, normal strength, sensation and reflexes    throughout       Assessment:     Medicare Wellness Exam       Plan:     During the course of the visit the patient was educated and counseled about appropriate screening and preventive services including:    Screening mammography   Dementia- Once again discussed the possibility of starting Aricept. She said she didn't remember the discussion from previously. Her daughter-in-law's here with her today so we discussed it again and I gave her an additional handout.  She also reports some hearing loss. She's been turning up the volume on things and asking for people to repeat things. We discussed that if she desired we could certainly refer her for formal hearing testing if she thinks she might benefit from a  hearing aid would be interested in one. She will discuss it with her family and think about it.  Keep continue regular walking for exercise.  Abnormal TSH-due to recheck level.  Osteopenia-due to recheck vitamin D level.  Diet review for nutrition referral? Yes ____  Not Indicated __x__   Patient Instructions (the written plan) was given to the patient.  Medicare Attestation I have personally reviewed: The patient's medical and social history Their use of alcohol, tobacco or illicit drugs Their current medications and supplements The patient's functional ability including ADLs,fall risks, home safety risks, cognitive, and hearing and visual impairment Diet and physical activities Evidence for depression or mood disorders  The patient's weight, height, BMI, and visual acuity have been recorded in the chart.  I have made referrals, counseling, and provided education to the patient based on review of the above and I have provided the patient with a written personalized care plan for preventive services.     Rumaysa Sabatino, MD   12/15/2015

## 2015-12-16 LAB — VITAMIN D 25 HYDROXY (VIT D DEFICIENCY, FRACTURES): VIT D 25 HYDROXY: 32 ng/mL (ref 30–100)

## 2016-05-06 DIAGNOSIS — H2589 Other age-related cataract: Secondary | ICD-10-CM | POA: Insufficient documentation

## 2016-05-20 ENCOUNTER — Encounter: Payer: Medicare PPO | Admitting: Sports Medicine

## 2016-07-30 ENCOUNTER — Emergency Department
Admission: EM | Admit: 2016-07-30 | Discharge: 2016-07-30 | Disposition: A | Discharge status: Home / Self Care | Payer: Medicare PPO | Source: Home / Self Care | Attending: Family Medicine | Admitting: Family Medicine

## 2016-07-30 DIAGNOSIS — R1032 Left lower quadrant pain: Secondary | ICD-10-CM | POA: Diagnosis not present

## 2016-07-30 LAB — POCT URINALYSIS DIP (MANUAL ENTRY)
Bilirubin, UA: NEGATIVE
Blood, UA: NEGATIVE
Glucose, UA: NEGATIVE
Ketones, POC UA: NEGATIVE
Nitrite, UA: NEGATIVE
Protein Ur, POC: NEGATIVE
Spec Grav, UA: 1.01 (ref 1.005–1.03)
Urobilinogen, UA: 0.2 (ref 0–1)
pH, UA: 5.5 (ref 5–8)

## 2016-07-30 MED ORDER — AMOXICILLIN-POT CLAVULANATE 875-125 MG PO TABS: 1.0000 | ORAL_TABLET | Freq: Two times a day (BID) | ORAL | 0 refills | Status: DC

## 2016-07-30 NOTE — ED Triage Notes (Signed)
Pt has been having LLQ abdominal pain for about 2 weeks.  It is intermittent, denies injury,lifting or straining

## 2016-07-30 NOTE — ED Provider Notes (Signed)
CSN: [id number]     Arrival date & time 07/30/16  1005 History   First MD Initiated Contact with Patient 07/30/16 1039     Chief Complaint  Patient presents with  . Abdominal Pain    LLQ   (Consider location/radiation/quality/duration/timing/severity/associated sxs/prior Treatment) HPI  Evelyn Greer is a 82 y.o. female presenting to UC with c/o intermittent LLQ abdominal pain/discomfort for about 2 weeks. Denies fever, chills, n/v/d. Denies urinary symptoms or back pain. Pain does not radiate.  Currently, pain is aching, 3/10, nothing makes it better or worse. Pt reports possible hx of diverticulitis several years ago.     Past Medical History:  Diagnosis Date  . Hearing loss    sensorineural  . Hyperlipidemia   . Hypertension   . Multiple thyroid nodules    nodules 2009 last U/S   Past Surgical History:  Procedure Laterality Date  . ABDOMINAL HYSTERECTOMY     TAH w/o oophrectomy for fibroids   Family History  Problem Relation Age of Onset  . Heart disease Mother 61    CHF  . Heart disease Father 62    heart attack  . Cancer Sister     premenopausal breast cancer  . Depression Brother 91    committed suicide    Social History  Substance Use Topics  . Smoking status: Never Smoker  . Smokeless tobacco: Not on file  . Alcohol use No   OB History    No data available     Review of Systems  Constitutional: Negative for chills and fever.  Respiratory: Negative for cough, chest tightness and shortness of breath.   Cardiovascular: Negative for chest pain and palpitations.  Gastrointestinal: Positive for abdominal pain ( LLQ). Negative for diarrhea, nausea and vomiting.  Genitourinary: Negative for dysuria, flank pain, frequency, hematuria, pelvic pain and urgency.  Musculoskeletal: Negative for back pain and myalgias.    Allergies  Atorvastatin  Home Medications   Prior to Admission medications   Medication Sig Start Date End Date Taking? Authorizing  Provider  amoxicillin-clavulanate (AUGMENTIN) 875-125 MG tablet Take 1 tablet by mouth 2 (two) times daily. One po bid x 7 days 07/30/16   Noland Fordyce, PA-C  b complex vitamins tablet Take 1 tablet by mouth daily.    Historical Provider, MD  Calcium Carbonate-Vitamin D (CALCIUM-VITAMIN D) 500-200 MG-UNIT tablet Take 1 tablet by mouth 2 (two) times daily.    Historical Provider, MD  cholecalciferol (VITAMIN D) 1000 units tablet Take 1,000 Units by mouth daily.    Historical Provider, MD  Multiple Minerals-Vitamins (CALCIUM & VIT D3 BONE HEALTH PO) Take by mouth.    Historical Provider, MD  naproxen (NAPROSYN) 250 MG tablet Take by mouth as needed.    Historical Provider, MD  vitamin C (ASCORBIC ACID) 500 MG tablet Take 500 mg by mouth daily.    Historical Provider, MD  vitamin E 200 UNIT capsule Take 200 Units by mouth daily.    Historical Provider, MD   Meds Ordered and Administered this Visit  Medications - No data to display  BP 158/89 (BP Location: Left Arm)   Pulse 68   Temp 98 F (36.7 C) (Oral)   Ht 5\' 4"  (1.626 m)   Wt 139 lb 6.4 oz (63.2 kg)   SpO2 98%   BMI 23.93 kg/m  No data found.   Physical Exam  Constitutional: She is oriented to person, place, and time. She appears well-developed and well-nourished. No distress.  HENT:  Head:  Normocephalic and atraumatic.  Mouth/Throat: Oropharynx is clear and moist.  Eyes: EOM are normal.  Neck: Normal range of motion. Neck supple.  Cardiovascular: Normal rate and regular rhythm.   Pulmonary/Chest: Effort normal.  Abdominal: Soft. She exhibits no distension and no mass. There is tenderness. There is no rebound, no guarding and no CVA tenderness. No hernia.    Musculoskeletal: Normal range of motion.  Neurological: She is alert and oriented to person, place, and time.  Skin: Skin is warm and dry. She is not diaphoretic.  Psychiatric: She has a normal mood and affect. Her behavior is normal.  Nursing note and vitals  reviewed.   Urgent Care Course   Clinical Course     Procedures (including critical care time)  Labs Review Labs Reviewed  POCT URINALYSIS DIP (MANUAL ENTRY) - Abnormal; Notable for the following:       Result Value   Leukocytes, UA Trace (*)    All other components within normal limits    Imaging Review No results found.  MDM   1. Left lower quadrant pain    Pt c/o LLQ abdominal pain w/o fever, chills, n/v/d. Possible hx of diverticulitis in the past.  Concern for mild diverticulitis at this time. Due to pt's age and mild symptoms will avoid contrast CT at this time and tx empirically with Augmentin for diverticulitis.  Encouraged f/u with PCP next week for recheck of symptoms. Discussed symptoms that warrant emergent care in the ED.     Noland Fordyce, PA-C 07/30/16 1419

## 2016-08-03 ENCOUNTER — Ambulatory Visit (HOSPITAL_BASED_OUTPATIENT_CLINIC_OR_DEPARTMENT_OTHER): Admission: RE | Admit: 2016-08-03 | Payer: Medicare PPO | Source: Ambulatory Visit

## 2016-08-03 ENCOUNTER — Telehealth: Payer: Self-pay | Admitting: *Deleted

## 2016-08-03 ENCOUNTER — Ambulatory Visit: Payer: Medicare PPO | Admitting: Family Medicine

## 2016-08-03 ENCOUNTER — Encounter: Payer: Self-pay | Admitting: *Deleted

## 2016-08-03 ENCOUNTER — Other Ambulatory Visit: Payer: Self-pay | Admitting: Family Medicine

## 2016-08-03 ENCOUNTER — Emergency Department
Admission: EM | Admit: 2016-08-03 | Discharge: 2016-08-03 | Disposition: A | Discharge status: Home / Self Care | Payer: Medicare PPO | Source: Home / Self Care | Attending: Family Medicine | Admitting: Family Medicine

## 2016-08-03 ENCOUNTER — Inpatient Hospital Stay (HOSPITAL_BASED_OUTPATIENT_CLINIC_OR_DEPARTMENT_OTHER): Admit: 2016-08-03 | Payer: Medicare PPO

## 2016-08-03 ENCOUNTER — Ambulatory Visit (HOSPITAL_BASED_OUTPATIENT_CLINIC_OR_DEPARTMENT_OTHER)
Admission: RE | Admit: 2016-08-03 | Discharge: 2016-08-03 | Disposition: A | Discharge status: Home / Self Care | Payer: Medicare PPO | Source: Ambulatory Visit | Attending: Family Medicine | Admitting: Family Medicine

## 2016-08-03 DIAGNOSIS — K429 Umbilical hernia without obstruction or gangrene: Secondary | ICD-10-CM | POA: Insufficient documentation

## 2016-08-03 DIAGNOSIS — N281 Cyst of kidney, acquired: Secondary | ICD-10-CM | POA: Insufficient documentation

## 2016-08-03 DIAGNOSIS — R1084 Generalized abdominal pain: Secondary | ICD-10-CM | POA: Diagnosis present

## 2016-08-03 DIAGNOSIS — I700 Atherosclerosis of aorta: Secondary | ICD-10-CM | POA: Diagnosis not present

## 2016-08-03 DIAGNOSIS — R1032 Left lower quadrant pain: Secondary | ICD-10-CM

## 2016-08-03 DIAGNOSIS — I712 Thoracic aortic aneurysm, without rupture: Secondary | ICD-10-CM | POA: Insufficient documentation

## 2016-08-03 DIAGNOSIS — Z90710 Acquired absence of both cervix and uterus: Secondary | ICD-10-CM | POA: Insufficient documentation

## 2016-08-03 DIAGNOSIS — K7689 Other specified diseases of liver: Secondary | ICD-10-CM | POA: Insufficient documentation

## 2016-08-03 DIAGNOSIS — R109 Unspecified abdominal pain: Secondary | ICD-10-CM

## 2016-08-03 DIAGNOSIS — R599 Enlarged lymph nodes, unspecified: Secondary | ICD-10-CM | POA: Insufficient documentation

## 2016-08-03 LAB — COMPLETE METABOLIC PANEL WITH GFR
ALT: 18 U/L (ref 6–29)
AST: 25 U/L (ref 10–35)
Albumin: 4.2 g/dL (ref 3.6–5.1)
Alkaline Phosphatase: 55 U/L (ref 33–130)
BUN: 14 mg/dL (ref 7–25)
CHLORIDE: 104 mmol/L (ref 98–110)
CO2: 27 mmol/L (ref 20–31)
CREATININE: 0.81 mg/dL (ref 0.60–0.88)
Calcium: 9.4 mg/dL (ref 8.6–10.4)
GFR, Est African American: 79 mL/min (ref >=60)
GFR, Est Non African American: 69 mL/min (ref >=60)
GLUCOSE: 94 mg/dL (ref 65–99)
POTASSIUM: 3.6 mmol/L (ref 3.5–5.3)
SODIUM: 137 mmol/L (ref 135–146)
Total Bilirubin: 0.5 mg/dL (ref 0.2–1.2)
Total Protein: 6.7 g/dL (ref 6.1–8.1)

## 2016-08-03 MED ORDER — IOPAMIDOL (ISOVUE-300) INJECTION 61%
100.0000 mL | Freq: Once | INTRAVENOUS | Status: AC | PRN
  Administered 2016-08-03: 100 mL via INTRAVENOUS

## 2016-08-03 NOTE — ED Triage Notes (Signed)
Pt c/o LT side abd pain intermittently x 4 mths. No OTC meds for the pain. She has been taking Vitamin C and E OTC.

## 2016-08-03 NOTE — Telephone Encounter (Signed)
Spoke with daughter in law and told results of CT and recommendation to follow up with Dr. Madilyn Fireman for colonoscopy or sigmoidoscopy.

## 2016-08-03 NOTE — ED Provider Notes (Signed)
[facility]    CSN: [id number] Arrival date & time: 08/03/16  1153     History   Chief Complaint Chief Complaint  Patient presents with  . Abdominal Pain    HPI Evelyn Greer is a 82 y.o. female.   Patient was evaluated here four days ago for abdominal pain and prescribed Augmentin.  However, she did not have the medication prescription filled.  She returns today with several family members reporting that she still has intermittent lower abdominal discomfort, but has not been acutely ill.  No nausea/vomiting.  No fevers, chills, and sweats.   Bowel movements have been somewhat loose, but not diarrhea.  Family notes that she has had intermittent abdominal pain for about three months, somewhat worse during the past 2 weeks.    The history is provided by the patient and a relative.    Past Medical History:  Diagnosis Date  . Hearing loss    sensorineural  . Hyperlipidemia   . Hypertension   . Multiple thyroid nodules    nodules 2009 last U/S    Patient Active Problem List   Diagnosis Date Noted  . Mild atherosclerosis of carotid artery 11/18/2014  . OSTEOPENIA 01/08/2010  . CARDIAC ARRHYTHMIA 11/27/2009  . ROSACEA 11/27/2009  . POSTMENOPAUSAL STATUS 11/27/2009  . Hyperlipidemia 09/27/2009  . Nontoxic uninodular goiter 09/26/2009  . ELEVATED BLOOD PRESSURE 09/26/2009    Past Surgical History:  Procedure Laterality Date  . ABDOMINAL HYSTERECTOMY     TAH w/o oophrectomy for fibroids    OB History    No data available       Home Medications    Prior to Admission medications   Medication Sig Start Date End Date Taking? Authorizing Provider  amoxicillin-clavulanate (AUGMENTIN) 875-125 MG tablet Take 1 tablet by mouth 2 (two) times daily.   Yes Historical Provider, MD  b complex vitamins tablet Take 1 tablet by mouth daily.    Historical Provider, MD  Calcium Carbonate-Vitamin D (CALCIUM-VITAMIN D) 500-200 MG-UNIT tablet Take 1 tablet by mouth 2  (two) times daily.    Historical Provider, MD  cholecalciferol (VITAMIN D) 1000 units tablet Take 1,000 Units by mouth daily.    Historical Provider, MD  Multiple Minerals-Vitamins (CALCIUM & VIT D3 BONE HEALTH PO) Take by mouth.    Historical Provider, MD  naproxen (NAPROSYN) 250 MG tablet Take by mouth as needed.    Historical Provider, MD  vitamin C (ASCORBIC ACID) 500 MG tablet Take 500 mg by mouth daily.    Historical Provider, MD  vitamin E 200 UNIT capsule Take 200 Units by mouth daily.    Historical Provider, MD    Family History Family History  Problem Relation Age of Onset  . Heart disease Mother 14    CHF  . Heart disease Father 65    heart attack  . Cancer Sister     premenopausal breast cancer  . Depression Brother 49    committed suicide     Social History Social History  Substance Use Topics  . Smoking status: Never Smoker  . Smokeless tobacco: Never Used  . Alcohol use No     Allergies   Atorvastatin   Review of Systems Review of Systems  Constitutional: Negative for activity change, appetite change, chills, diaphoresis, fatigue, fever and unexpected weight change.  HENT: Negative.   Eyes: Negative.   Respiratory: Negative.   Cardiovascular: Negative.   Gastrointestinal: Positive for abdominal pain and diarrhea. Negative for abdominal distention, anal  bleeding, blood in stool, constipation, nausea, rectal pain and vomiting.  Genitourinary: Negative.   Musculoskeletal: Negative.   Skin: Negative.   Neurological: Negative.      Physical Exam Triage Vital Signs ED Triage Vitals  Enc Vitals Group     BP 08/03/16 1216 150/81     Pulse Rate 08/03/16 1216 72     Resp 08/03/16 1216 16     Temp 08/03/16 1216 98 F (36.7 C)     Temp Source 08/03/16 1216 Oral     SpO2 08/03/16 1216 97 %     Weight 08/03/16 1216 138 lb (62.6 kg)     Height --      Head Circumference --      Peak Flow --      Pain Score 08/03/16 1217 6     Pain Loc --      Pain  Edu? --      Excl. in [place]? --    No data found.   Updated Vital Signs BP 150/81 (BP Location: Left Arm)   Pulse 72   Temp 98 F (36.7 C) (Oral)   Resp 16   Wt 138 lb (62.6 kg)   SpO2 97%   BMI 23.69 kg/m   Visual Acuity Right Eye Distance:   Left Eye Distance:   Bilateral Distance:    Right Eye Near:   Left Eye Near:    Bilateral Near:     Physical Exam  Constitutional: She appears well-developed and well-nourished. No distress.  HENT:  Head: Normocephalic.  Right Ear: External ear normal.  Left Ear: External ear normal.  Nose: Nose normal.  Mouth/Throat: Oropharynx is clear and moist.  Eyes: Conjunctivae are normal. Pupils are equal, round, and reactive to light.  Neck: Neck supple.  Cardiovascular: Normal heart sounds.   Pulmonary/Chest: Breath sounds normal.  Abdominal: Soft. Normal appearance and bowel sounds are normal. She exhibits no distension and no mass. There is no hepatosplenomegaly. There is tenderness in the left upper quadrant. There is no rigidity and no guarding. No hernia.    There is vague tenderness to palpation left upper quadrant below costal margin, not quite extending to left flank.  No rib tenderness.  No masses.  No splenomegaly.    Musculoskeletal: She exhibits no edema.  Lymphadenopathy:    She has no cervical adenopathy.  Neurological: She is alert.  Skin: Skin is warm and dry. No rash noted.  Nursing note and vitals reviewed.    UC Treatments / Results  Labs (all labs ordered are listed, but only abnormal results are displayed) Labs Reviewed  COMPLETE METABOLIC PANEL WITH GFR   Narrative:    Performed at:  [facility]                [address], [place]                [place], [place] [address]    EKG  EKG Interpretation None       Radiology Study Result   CLINICAL DATA:  Generalized abdominal pain and tenderness for 3 months. Symptoms have gotten worse over the past 2  days.  EXAM: CT ABDOMEN AND PELVIS WITH CONTRAST  TECHNIQUE: Multidetector CT imaging of the abdomen and pelvis was performed using the standard protocol following bolus administration of intravenous contrast.  CONTRAST:  123mL ISOVUE-300 IOPAMIDOL (ISOVUE-300) INJECTION 61%  COMPARISON:  None.  FINDINGS: Lower chest: 4 mm nodule along the right  minor fissure on sequence 6, image 6. There may be another small nodule in the right lower lobe on image 5. No pleural effusions. Ascending thoracic aorta is enlarged measuring up to 4.4 cm. No evidence for a dissection involving the visualized thoracic aorta.  Hepatobiliary: There are at least 3 low-density cysts in the liver, largest involving the left hepatic lobe measuring up to 5.2 cm. Portal venous system is patent. Gallbladder appears normal and decompressed. Difficult to exclude a small gallstone.  Pancreas: Normal appearance of the pancreas without inflammation or duct dilatation.  Spleen: Normal appearance of spleen without enlargement.  Adrenals/Urinary Tract: Normal adrenal glands. Normal appearance of both kidneys without hydronephrosis. There is an exophytic cyst in left kidney upper pole. Additional small cyst in left kidney lower pole. Moderate distention of the urinary bladder.  Stomach/Bowel: There is concern for focal wall thickening along the right posterior aspect of the rectum on sequence 2, image 83. Findings raises concern for a rectal lesion. Mild distension of the transverse colon and right colon with stool and contrast. Normal appearance of the appendix. There is no significant small bowel dilatation.  Vascular/Lymphatic: Mild atherosclerotic disease in the abdominal aorta without aortic aneurysm. Incidentally, there is a retroaortic left renal vein. No significant lymph node enlargement in the abdomen or pelvis. Small perirectal lymph node on sequence 2, image 80.  Reproductive: Status  post hysterectomy. No adnexal masses.  Other: No free fluid. No free air. Small umbilical hernia containing fat.  Musculoskeletal: No acute abnormality.  IMPRESSION: Focal wall thickening along the right posterior aspect of the rectum. Findings are highly concerning for a rectal lesion and recommend further characterization with colonoscopy or sigmoidoscopy. There is a small indeterminate perirectal lymph node.  Aneurysmal dilatation of the ascending thoracic aorta measuring up to 4.4 cm. Recommend annual imaging followup by CTA or MRA. This recommendation follows 2010 ACCF/AHA/AATS/ACR/ASA/SCA/SCAI/SIR/STS/SVM Guidelines for the Diagnosis and Management of Patients with Thoracic Aortic Disease. Circulation. 2010; [id number]  Hepatic and renal cysts. Large cyst involving the left hepatic lobe.  Small pulmonary nodules are indeterminate. No follow-up needed if patient is low-risk (and has no known or suspected primary neoplasm). Non-contrast chest CT can be considered in 12 months if patient is high-risk. This recommendation follows the consensus statement: Guidelines for Management of Incidental Pulmonary Nodules Detected on CT Images: From the Fleischner Society 2017; Radiology 2017; 284:228-243.  These results will be called to the ordering clinician or representative by the Radiologist Assistant, and communication documented in the PACS or zVision Dashboard.   Electronically Signed   By: Markus Daft M.D.   On: 08/03/2016 18:05     Procedures Procedures (including critical care time)  Medications Ordered in UC Medications - No data to display   Initial Impression / Assessment and Plan / UC Course  I have reviewed the triage vital signs and the nursing notes.  Pertinent labs & imaging results that were available during my care of the patient were reviewed by me and considered in my medical decision making (see chart for details).  Clinical Course    No evidence acute abdominal process.  CT scan findings are concerning for a rectal lesion.  Recommend that patient follow-up with her PCP as soon as possible to arrange GI evaluation. May take Tylenol as needed for pain. If symptoms become significantly worse during the night or over the weekend, proceed to the local emergency room.      Final Clinical Impressions(s) / UC Diagnoses  Final diagnoses:  Left lower quadrant pain  Left sided abdominal pain    New Prescriptions Discharge Medication List as of 08/03/2016  2:05 PM       Kandra Nicolas, MD 08/15/16 1327

## 2016-08-03 NOTE — ED Notes (Signed)
[employer] ([place]) [place] # for CT abd/ pelvis [id number].

## 2016-08-04 ENCOUNTER — Telehealth: Payer: Self-pay | Admitting: *Deleted

## 2016-08-04 NOTE — Telephone Encounter (Signed)
Pt's daughter in law,Bairon Klemann, called wanting to know if she should take Augmentin that was prescribed on 07/30/16 that she never picked up from the pharmacy and is it okay to take Tylenol for the pain. Per Dr. Assunta Found no need to taken Augmentin and Tylenol is okay to take for pain. Be sure to schedule f/u appt with dr Madilyn Fireman. LM on Kenzy Campoverde's VM with dr beese's recommendations and to call back if she has any questions or concerns.

## 2016-08-15 NOTE — Discharge Instructions (Signed)
May take Tylenol as needed for pain.  If symptoms become significantly worse during the night or over the weekend, proceed to the local emergency room.  

## 2016-08-24 ENCOUNTER — Encounter: Payer: Self-pay | Admitting: Family Medicine

## 2016-08-24 ENCOUNTER — Ambulatory Visit (INDEPENDENT_AMBULATORY_CARE_PROVIDER_SITE_OTHER): Payer: Medicare PPO | Admitting: Family Medicine

## 2016-08-24 VITALS — BP 138/68 | HR 76 | Ht 64.0 in | Wt 141.0 lb

## 2016-08-24 DIAGNOSIS — K629 Disease of anus and rectum, unspecified: Secondary | ICD-10-CM | POA: Diagnosis not present

## 2016-08-24 DIAGNOSIS — G301 Alzheimer's disease with late onset: Secondary | ICD-10-CM

## 2016-08-24 DIAGNOSIS — F0280 Dementia in other diseases classified elsewhere without behavioral disturbance: Secondary | ICD-10-CM

## 2016-08-24 DIAGNOSIS — I712 Thoracic aortic aneurysm, without rupture: Secondary | ICD-10-CM | POA: Diagnosis not present

## 2016-08-24 DIAGNOSIS — F0390 Unspecified dementia without behavioral disturbance: Secondary | ICD-10-CM | POA: Insufficient documentation

## 2016-08-24 DIAGNOSIS — I7121 Aneurysm of the ascending aorta, without rupture: Secondary | ICD-10-CM

## 2016-08-24 DIAGNOSIS — R918 Other nonspecific abnormal finding of lung field: Secondary | ICD-10-CM | POA: Diagnosis not present

## 2016-08-24 DIAGNOSIS — F03B0 Unspecified dementia, moderate, without behavioral disturbance, psychotic disturbance, mood disturbance, and anxiety: Secondary | ICD-10-CM | POA: Insufficient documentation

## 2016-08-24 NOTE — Progress Notes (Signed)
Subjective:    Patient ID: Evelyn Greer, female    DOB: [birth year], 83 y.o.   MRN: [id number]  HPI 83 year old female comes in today to follow-up on results of the CT that was performed in urgent care.  Patient does have some early dementia and so her daughter-in-law is here with her today. She went to urgent care on December 19 for abdominal pain. CT abdomen and pelvis with contrast was performed.  Unfortunately it revealed some thickening of the rectal wall. They recommended colonoscopy for further evaluation. Last colonoscopy 01/2010. Her daughter in law reports that she did c/o of pain on Sunday, 3 days ago, with some BRB in stool.    She was also noted on the scan to have an aneurysm of the ascending thoracic aorta at 4.4 cm. Recommend repeat CT in one year for further evaluation.  She is also did have pulmonary nodules and they recommended repeat scan in one year as well if she is high risk.   Exam Information   Status Exam Begun  Exam Ended   Final [99] 08/03/2016 5:20 PM 08/03/2016 5:40 PM  PACS Images   Show images for CT ABDOMEN PELVIS W CONTRAST  Study Result   CLINICAL DATA:  Generalized abdominal pain and tenderness for 3 months. Symptoms have gotten worse over the past 2 days.  EXAM: CT ABDOMEN AND PELVIS WITH CONTRAST  TECHNIQUE: Multidetector CT imaging of the abdomen and pelvis was performed using the standard protocol following bolus administration of intravenous contrast.  CONTRAST:  113mL ISOVUE-300 IOPAMIDOL (ISOVUE-300) INJECTION 61%  COMPARISON:  None.  FINDINGS: Lower chest: 4 mm nodule along the right minor fissure on sequence 6, image 6. There may be another small nodule in the right lower lobe on image 5. No pleural effusions. Ascending thoracic aorta is enlarged measuring up to 4.4 cm. No evidence for a dissection involving the visualized thoracic aorta.  Hepatobiliary: There are at least 3 low-density cysts in the liver, largest  involving the left hepatic lobe measuring up to 5.2 cm. Portal venous system is patent. Gallbladder appears normal and decompressed. Difficult to exclude a small gallstone.  Pancreas: Normal appearance of the pancreas without inflammation or duct dilatation.  Spleen: Normal appearance of spleen without enlargement.  Adrenals/Urinary Tract: Normal adrenal glands. Normal appearance of both kidneys without hydronephrosis. There is an exophytic cyst in left kidney upper pole. Additional small cyst in left kidney lower pole. Moderate distention of the urinary bladder.  Stomach/Bowel: There is concern for focal wall thickening along the right posterior aspect of the rectum on sequence 2, image 83. Findings raises concern for a rectal lesion. Mild distension of the transverse colon and right colon with stool and contrast. Normal appearance of the appendix. There is no significant small bowel dilatation.  Vascular/Lymphatic: Mild atherosclerotic disease in the abdominal aorta without aortic aneurysm. Incidentally, there is a retroaortic left renal vein. No significant lymph node enlargement in the abdomen or pelvis. Small perirectal lymph node on sequence 2, image 80.  Reproductive: Status post hysterectomy. No adnexal masses.  Other: No free fluid. No free air. Small umbilical hernia containing fat.  Musculoskeletal: No acute abnormality.  IMPRESSION: Focal wall thickening along the right posterior aspect of the rectum. Findings are highly concerning for a rectal lesion and recommend further characterization with colonoscopy or sigmoidoscopy. There is a small indeterminate perirectal lymph node.  Aneurysmal dilatation of the ascending thoracic aorta measuring up to 4.4 cm. Recommend annual imaging followup by CTA  or MRA. This recommendation follows 2010 ACCF/AHA/AATS/ACR/ASA/SCA/SCAI/SIR/STS/SVM Guidelines for the Diagnosis and Management of Patients with Thoracic  Aortic Disease. Circulation. 2010; [id number]  Hepatic and renal cysts. Large cyst involving the left hepatic lobe.  Small pulmonary nodules are indeterminate. No follow-up needed if patient is low-risk (and has no known or suspected primary neoplasm). Non-contrast chest CT can be considered in 12 months if patient is high-risk. This recommendation follows the consensus statement: Guidelines for Management of Incidental Pulmonary Nodules Detected on CT Images: From the Fleischner Society 2017; Radiology 2017; 284:228-243.  These results will be called to the ordering clinician or representative by the Radiologist Assistant, and communication documented in the PACS or zVision Dashboard.   Electronically Signed   By: Markus Daft M.D.   On: 08/03/2016 18:05      Review of Systems   BP 138/68   Pulse 76   Ht 5\' 4"  (1.626 m)   Wt 141 lb (64 kg)   SpO2 98%   BMI 24.20 kg/m     Allergies  Allergen Reactions  . Atorvastatin     REACTION: Pain in legs    Past Medical History:  Diagnosis Date  . Hearing loss    sensorineural  . Hyperlipidemia   . Hypertension   . Multiple thyroid nodules    nodules 2009 last U/S    Past Surgical History:  Procedure Laterality Date  . ABDOMINAL HYSTERECTOMY     TAH w/o oophrectomy for fibroids    Social History   Social History  . Marital status: Single    Spouse name: N/A  . Number of children: N/A  . Years of education: N/A   Occupational History  . Retired.     Social History Main Topics  . Smoking status: Never Smoker  . Smokeless tobacco: Never Used  . Alcohol use No  . Drug use: No  . Sexual activity: Not Currently   Other Topics Concern  . Not on file   Social History Narrative   Lives alone    Family History  Problem Relation Age of Onset  . Heart disease Mother 74    CHF  . Heart disease Father 19    heart attack  . Cancer Sister     premenopausal breast cancer  . Depression Brother 31     committed suicide     Outpatient Encounter Prescriptions as of 08/24/2016  Medication Sig  . b complex vitamins tablet Take 1 tablet by mouth daily.  . Calcium Carbonate-Vitamin D (CALCIUM-VITAMIN D) 500-200 MG-UNIT tablet Take 1 tablet by mouth 2 (two) times daily.  . cholecalciferol (VITAMIN D) 1000 units tablet Take 1,000 Units by mouth daily.  . Multiple Minerals-Vitamins (CALCIUM & VIT D3 BONE HEALTH PO) Take by mouth.  . vitamin C (ASCORBIC ACID) 500 MG tablet Take 500 mg by mouth daily.  . vitamin E 200 UNIT capsule Take 200 Units by mouth daily.  . [DISCONTINUED] naproxen (NAPROSYN) 250 MG tablet Take by mouth as needed.  . [DISCONTINUED] amoxicillin-clavulanate (AUGMENTIN) 875-125 MG tablet Take 1 tablet by mouth 2 (two) times daily.   No facility-administered encounter medications on file as of 08/24/2016.          Objective:   Physical Exam  Constitutional: She is oriented to person, place, and time. She appears well-developed and well-nourished.  HENT:  Head: Normocephalic and atraumatic.  Eyes: Conjunctivae and EOM are normal.  Cardiovascular: Normal rate.   Pulmonary/Chest: Effort normal.  Neurological: She is alert  and oriented to person, place, and time.  Skin: Skin is dry. No pallor.  Psychiatric: She has a normal mood and affect. Her behavior is normal.  Vitals reviewed.      Assessment & Plan:  Rectal wall thickening - Will refer to GI for colonoscopy. She will need further evaluation discussed the importance of this with her. Hopefully they can get her in within the next couple of weeks. Could be scar tissue but could also be concerning for rectal cancer. Her last, he was in 2011. She would prefer to go back to digestive health which is where she thinks that she went in 2011.  Aortic aneurysm, ascending - discussed following up in 1 year with either CT or MRI for further imaging. It's currently 4.4 cm and she is asymptomatic. We discussed the importance of  keeping her blood pressure under good control. No hx of HTN.  Pulmonary nodules - Recommend repeat CT chest in one year.

## 2016-09-09 DIAGNOSIS — R938 Abnormal findings on diagnostic imaging of other specified body structures: Secondary | ICD-10-CM | POA: Diagnosis not present

## 2016-09-09 DIAGNOSIS — R1032 Left lower quadrant pain: Secondary | ICD-10-CM | POA: Diagnosis not present

## 2016-10-04 DIAGNOSIS — E7800 Pure hypercholesterolemia, unspecified: Secondary | ICD-10-CM | POA: Diagnosis not present

## 2016-10-04 DIAGNOSIS — R933 Abnormal findings on diagnostic imaging of other parts of digestive tract: Secondary | ICD-10-CM | POA: Diagnosis not present

## 2016-10-04 DIAGNOSIS — R1032 Left lower quadrant pain: Secondary | ICD-10-CM | POA: Diagnosis not present

## 2016-10-04 DIAGNOSIS — K629 Disease of anus and rectum, unspecified: Secondary | ICD-10-CM | POA: Diagnosis not present

## 2016-10-04 DIAGNOSIS — D127 Benign neoplasm of rectosigmoid junction: Secondary | ICD-10-CM | POA: Diagnosis not present

## 2016-10-04 DIAGNOSIS — D128 Benign neoplasm of rectum: Secondary | ICD-10-CM | POA: Diagnosis not present

## 2016-10-07 DIAGNOSIS — C20 Malignant neoplasm of rectum: Secondary | ICD-10-CM | POA: Diagnosis not present

## 2016-10-13 DIAGNOSIS — K6289 Other specified diseases of anus and rectum: Secondary | ICD-10-CM | POA: Diagnosis not present

## 2016-10-13 DIAGNOSIS — C20 Malignant neoplasm of rectum: Secondary | ICD-10-CM | POA: Diagnosis not present

## 2016-10-15 DIAGNOSIS — D128 Benign neoplasm of rectum: Secondary | ICD-10-CM | POA: Diagnosis not present

## 2016-10-16 DIAGNOSIS — D128 Benign neoplasm of rectum: Secondary | ICD-10-CM | POA: Insufficient documentation

## 2016-10-22 DIAGNOSIS — D128 Benign neoplasm of rectum: Secondary | ICD-10-CM | POA: Diagnosis not present

## 2016-10-28 DIAGNOSIS — K621 Rectal polyp: Secondary | ICD-10-CM | POA: Diagnosis not present

## 2016-10-28 DIAGNOSIS — Z79899 Other long term (current) drug therapy: Secondary | ICD-10-CM | POA: Diagnosis not present

## 2016-10-28 DIAGNOSIS — Z01818 Encounter for other preprocedural examination: Secondary | ICD-10-CM | POA: Diagnosis not present

## 2016-10-28 DIAGNOSIS — R102 Pelvic and perineal pain: Secondary | ICD-10-CM | POA: Diagnosis not present

## 2016-10-28 DIAGNOSIS — E785 Hyperlipidemia, unspecified: Secondary | ICD-10-CM | POA: Diagnosis not present

## 2016-11-01 DIAGNOSIS — C20 Malignant neoplasm of rectum: Secondary | ICD-10-CM | POA: Diagnosis not present

## 2016-11-01 DIAGNOSIS — E785 Hyperlipidemia, unspecified: Secondary | ICD-10-CM | POA: Diagnosis not present

## 2016-11-01 DIAGNOSIS — Z888 Allergy status to other drugs, medicaments and biological substances status: Secondary | ICD-10-CM | POA: Diagnosis not present

## 2016-11-01 DIAGNOSIS — K621 Rectal polyp: Secondary | ICD-10-CM | POA: Diagnosis not present

## 2016-11-02 DIAGNOSIS — C20 Malignant neoplasm of rectum: Secondary | ICD-10-CM | POA: Diagnosis not present

## 2016-11-02 DIAGNOSIS — Z888 Allergy status to other drugs, medicaments and biological substances status: Secondary | ICD-10-CM | POA: Diagnosis not present

## 2016-11-02 DIAGNOSIS — E785 Hyperlipidemia, unspecified: Secondary | ICD-10-CM | POA: Diagnosis not present

## 2016-11-29 DIAGNOSIS — C20 Malignant neoplasm of rectum: Secondary | ICD-10-CM | POA: Insufficient documentation

## 2017-02-28 DIAGNOSIS — C20 Malignant neoplasm of rectum: Secondary | ICD-10-CM | POA: Diagnosis not present

## 2017-05-19 ENCOUNTER — Encounter: Payer: Self-pay | Admitting: Family Medicine

## 2017-05-19 ENCOUNTER — Ambulatory Visit (INDEPENDENT_AMBULATORY_CARE_PROVIDER_SITE_OTHER): Payer: Medicare HMO | Admitting: Family Medicine

## 2017-05-19 VITALS — BP 133/82 | HR 78 | Ht 64.0 in | Wt 144.0 lb

## 2017-05-19 DIAGNOSIS — Z0000 Encounter for general adult medical examination without abnormal findings: Secondary | ICD-10-CM

## 2017-05-19 DIAGNOSIS — G301 Alzheimer's disease with late onset: Secondary | ICD-10-CM

## 2017-05-19 DIAGNOSIS — I6529 Occlusion and stenosis of unspecified carotid artery: Secondary | ICD-10-CM

## 2017-05-19 DIAGNOSIS — F0280 Dementia in other diseases classified elsewhere without behavioral disturbance: Secondary | ICD-10-CM

## 2017-05-19 DIAGNOSIS — E559 Vitamin D deficiency, unspecified: Secondary | ICD-10-CM | POA: Diagnosis not present

## 2017-05-19 DIAGNOSIS — Z85048 Personal history of other malignant neoplasm of rectum, rectosigmoid junction, and anus: Secondary | ICD-10-CM | POA: Diagnosis not present

## 2017-05-19 DIAGNOSIS — Z23 Encounter for immunization: Secondary | ICD-10-CM | POA: Diagnosis not present

## 2017-05-19 NOTE — Patient Instructions (Addendum)

## 2017-05-19 NOTE — Progress Notes (Signed)
Subjective:   Evelyn Greer is a 83 y.o. female who presents for Medicare Annual (Subsequent) preventive examination.  She is here with her daughter-in-law Altha Harm and her granddaughter. She says that her memory is "off today" because she is hungry and is fasting for blood work.  She still is able to do most of her ADLs including some minimal food preparation. Currently she comes Allegra Lai with the whole family that her daughter-in-law prepares.  I last saw her in January which she was noted to have some thickening of the rectum on a CT scan. She was for her to GI who then referred her to general surgery for removal of an adenoma, rectal cancer. She's been doing well since her surgery without any problems. She was supposed to follow up in 3-4 months which should be in November.  Her daughter-in-law also notes that she's been complaining that her fetus been going numb intermittently for about the last 6 months. Patient says it's worse if she crosses her legs but really isn't very bothersome to her. It's mostly just that she's been sitting for prolonged. It doesn't bother her when she walks and she denies having any low back pain.   Dementia-she is no longer able to pay her own bills and balance her checkbook. She repeats herself frequently. She's had episodes where she's gone out to eat and then didn't remember going just a few hours later.   Review of Systems:  Comprehensive ROS is negative.         Objective:     Vitals: BP 133/82   Pulse 78   Ht 5\' 4"  (1.626 m)   Wt 144 lb (65.3 kg)   SpO2 97%   BMI 24.72 kg/m   Body mass index is 24.72 kg/m.   Tobacco History  Smoking Status  . Never Smoker  Smokeless Tobacco  . Never Used     Counseling given: Not Answered   Past Medical History:  Diagnosis Date  . Hearing loss    sensorineural  . Hyperlipidemia   . Hypertension   . Multiple thyroid nodules    nodules 2009 last U/S   Past Surgical History:   Procedure Laterality Date  . ABDOMINAL HYSTERECTOMY     TAH w/o oophrectomy for fibroids   Family History  Problem Relation Age of Onset  . Heart disease Mother 5       CHF  . Heart disease Father 30       heart attack  . Cancer Sister        premenopausal breast cancer  . Depression Brother 9       committed suicide    History  Sexual Activity  . Sexual activity: Not Currently    Outpatient Encounter Prescriptions as of 05/19/2017  Medication Sig  . b complex vitamins tablet Take 1 tablet by mouth daily.  . Calcium Carbonate-Vitamin D (CALCIUM-VITAMIN D) 500-200 MG-UNIT tablet Take 1 tablet by mouth 2 (two) times daily.  . cholecalciferol (VITAMIN D) 1000 units tablet Take 1,000 Units by mouth daily.  . Multiple Minerals-Vitamins (CALCIUM & VIT D3 BONE HEALTH PO) Take by mouth.  . vitamin C (ASCORBIC ACID) 500 MG tablet Take 500 mg by mouth daily.  . vitamin E 200 UNIT capsule Take 200 Units by mouth daily.   No facility-administered encounter medications on file as of 05/19/2017.     Activities of Daily Living No flowsheet data found.  Patient Care Team: Hali Marry, MD  as PCP - General (Family Medicine)    Assessment:    Physical Exam  Constitutional: She is oriented to person, place, and time and well-developed, well-nourished, and in no distress.  HENT:  Head: Normocephalic and atraumatic.  Eyes: Pupils are equal, round, and reactive to light. Conjunctivae and EOM are normal.  Neck: Neck supple. No thyromegaly present.  Cardiovascular: Normal rate, regular rhythm and normal heart sounds.   Respiratory: Effort normal and breath sounds normal.  Lymphadenopathy:    She has no cervical adenopathy.  Neurological: She is alert and oriented to person, place, and time.   Physical Exam  Constitutional: She is oriented to person, place, and time and well-developed, well-nourished, and in no distress.  HENT:  Head: Normocephalic and atraumatic.  Eyes:  Pupils are equal, round, and reactive to light. Conjunctivae and EOM are normal.  Neck: Neck supple. No thyromegaly present.  Cardiovascular: Normal rate, regular rhythm and normal heart sounds.   Pulmonary/Chest: Effort normal and breath sounds normal.  Lymphadenopathy:    She has no cervical adenopathy.  Neurological: She is alert and oriented to person, place, and time.    Exercise Activities and Dietary recommendations Current Exercise Habits: The patient does not participate in regular exercise at present  Goals    None     Fall Risk Fall Risk  05/19/2017 05/19/2017 12/15/2015 09/09/2014 03/22/2013  Falls in the past year? Yes No No No No  Number falls in past yr: 1 - - - -  Injury with Fall? Yes - - - -  Follow up Falls prevention discussed - - - -   Depression Screen PHQ 2/9 Scores 05/19/2017 12/15/2015 09/09/2014 03/22/2013  PHQ - 2 Score 0 0 1 0     Cognitive Function     6CIT Screen 05/19/2017  What Year? 4 points  What month? 3 points  What time? 0 points  Count back from 20 4 points  Months in reverse 4 points  Repeat phrase 8 points  Total Score 23    Immunization History  Administered Date(s) Administered  . Influenza, High Dose Seasonal PF 05/19/2017  . Influenza,inj,Quad PF,6+ Mos 09/09/2014  . Pneumococcal Conjugate-13 09/09/2014  . Pneumococcal Polysaccharide-23 01/05/2010  . Tdap 02/22/2011   Screening Tests Health Maintenance  Topic Date Due  . INFLUENZA VACCINE  11/13/2017 (Originally 03/16/2017)  . TETANUS/TDAP  02/21/2021  . DEXA SCAN  Completed  . PNA vac Low Risk Adult  Completed      Plan:   Medicare WEllness   I have personally reviewed and noted the following in the patient's chart:   . Medical and social history . Use of alcohol, tobacco or illicit drugs  . Current medications and supplements . Functional ability and status . Nutritional status . Physical activity . Advanced directives . List of other physicians . Hospitalizations,  surgeries, and ER visits in previous 12 months . Vitals . Screenings to include cognitive, depression, and falls . Referrals and appointments . She will need CT abdomen chest follow-up on pulmonary nodules and thoracic aortic aneurysm seen previously for 1 year recheck. . Left ankle swelling-after a left ankle sprain from a fall in the spring she's had intermittent left ankle swelling. It's not persistent and it's not painful or bothersome. I do not recommend any specific intervention or treatment at this time. . Dementia-symptoms worsening. She actually reintroduced her daughter-in-law about 5 times during the office visit today. We did lab work last year just to rule out other causes  of think we should move forward the brain MRI just for full evaluation. 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 We did discuss possibly restarting Aricept and maybe putting her on a low-dose aspirin if she's not high risk for GI bleed based on age and recent bowel surgery.  In addition, I have reviewed and discussed with patient certain preventive protocols, quality metrics, and best practice recommendations. A written personalized care plan for preventive services as well as general preventive health recommendations were provided to patient.     Beatrice Lecher, MD  05/19/2017

## 2017-05-20 ENCOUNTER — Telehealth: Payer: Self-pay | Admitting: Family Medicine

## 2017-05-20 ENCOUNTER — Encounter: Payer: Self-pay | Admitting: Family Medicine

## 2017-05-20 DIAGNOSIS — R918 Other nonspecific abnormal finding of lung field: Secondary | ICD-10-CM

## 2017-05-20 DIAGNOSIS — I712 Thoracic aortic aneurysm, without rupture, unspecified: Secondary | ICD-10-CM

## 2017-05-20 DIAGNOSIS — R911 Solitary pulmonary nodule: Secondary | ICD-10-CM

## 2017-05-20 DIAGNOSIS — F0390 Unspecified dementia without behavioral disturbance: Secondary | ICD-10-CM

## 2017-05-20 LAB — COMPLETE METABOLIC PANEL WITH GFR
AG RATIO: 1.5 (calc) (ref 1.0–2.5)
ALBUMIN MSPROF: 4.1 g/dL (ref 3.6–5.1)
ALT: 16 U/L (ref 6–29)
AST: 23 U/L (ref 10–35)
Alkaline phosphatase (APISO): 57 U/L (ref 33–130)
BILIRUBIN TOTAL: 0.7 mg/dL (ref 0.2–1.2)
BUN / CREAT RATIO: 12 (calc) (ref 6–22)
BUN: 11 mg/dL (ref 7–25)
CALCIUM: 9.3 mg/dL (ref 8.6–10.4)
CHLORIDE: 107 mmol/L (ref 98–110)
CO2: 26 mmol/L (ref 20–32)
Creat: 0.9 mg/dL — ABNORMAL HIGH (ref 0.60–0.88)
GFR, EST AFRICAN AMERICAN: 69 mL/min/{1.73_m2} (ref >=60)
GFR, EST NON AFRICAN AMERICAN: 60 mL/min/{1.73_m2} (ref >=60)
GLOBULIN: 2.8 g/dL (ref 1.9–3.7)
Glucose, Bld: 89 mg/dL (ref 65–99)
POTASSIUM: 3.6 mmol/L (ref 3.5–5.3)
SODIUM: 140 mmol/L (ref 135–146)
TOTAL PROTEIN: 6.9 g/dL (ref 6.1–8.1)

## 2017-05-20 LAB — LIPID PANEL W/REFLEX DIRECT LDL
Cholesterol: 253 mg/dL — ABNORMAL HIGH (ref <200)
HDL: 69 mg/dL (ref >50)
LDL Cholesterol (Calc): 165 mg/dL (calc) — ABNORMAL HIGH
NON-HDL CHOLESTEROL (CALC): 184 mg/dL — AB (ref <130)
Total CHOL/HDL Ratio: 3.7 (calc) (ref <5.0)
Triglycerides: 85 mg/dL (ref <150)

## 2017-05-20 LAB — VITAMIN D 25 HYDROXY (VIT D DEFICIENCY, FRACTURES): Vit D, 25-Hydroxy: 27 ng/mL — ABNORMAL LOW (ref 30–100)

## 2017-05-20 NOTE — Telephone Encounter (Signed)
Plan to schedule MRI of brain to evaluate Alzheimer's dementia further for January as well as CT of chest follow-up on pulmonary nodules and CT of abdomen to follow-up on thoracic aortic aneurysm.

## 2017-06-02 ENCOUNTER — Ambulatory Visit (INDEPENDENT_AMBULATORY_CARE_PROVIDER_SITE_OTHER): Payer: Medicare HMO | Admitting: Family Medicine

## 2017-06-02 ENCOUNTER — Encounter: Payer: Self-pay | Admitting: Family Medicine

## 2017-06-02 DIAGNOSIS — F0280 Dementia in other diseases classified elsewhere without behavioral disturbance: Secondary | ICD-10-CM

## 2017-06-02 DIAGNOSIS — G301 Alzheimer's disease with late onset: Secondary | ICD-10-CM | POA: Diagnosis not present

## 2017-06-02 MED ORDER — DONEPEZIL HCL 5 MG PO TABS: 5.0000 mg | ORAL_TABLET | Freq: Every day | ORAL | 1 refills | Status: DC

## 2017-06-02 NOTE — Progress Notes (Signed)
Subjective:    Patient ID: Evelyn Greer, female    DOB: 1934-08-09, 83 y.o.   MRN: [id number]  HPI 83 year old female comes in today for further evaluation of memory impairment. She filled her 6CIT screen on her Medicare wellness exam. She's had some significant memory impairment for the last couple of years. She continues to repeat herself. Did go ahead and place a order for MRI that she wants to wait until January to have it done which I think is reasonable at this point. It's probably not significantly alter care at this point. Patient seems very concerned that she didn't actually eat breakfast and that's what is impairing her memory. She repeats a lot of things in the rain and when she was here last time for her wellness visit kept reintroducing her daughter in law during the same visit.   Review of Systems    BP 132/80   Pulse 79   Ht 5\' 4"  (1.626 m)   Wt 145 lb (65.8 kg)   SpO2 99%   BMI 24.89 kg/m     Allergies  Allergen Reactions  . Atorvastatin     REACTION: Pain in legs    Past Medical History:  Diagnosis Date  . Hearing loss    sensorineural  . Hyperlipidemia   . Hypertension   . Multiple thyroid nodules    nodules 2009 last U/S    Past Surgical History:  Procedure Laterality Date  . ABDOMINAL HYSTERECTOMY     TAH w/o oophrectomy for fibroids    Social History   Social History  . Marital status: Single    Spouse name: N/A  . Number of children: N/A  . Years of education: N/A   Occupational History  . Retired.     Social History Main Topics  . Smoking status: Never Smoker  . Smokeless tobacco: Never Used  . Alcohol use No  . Drug use: No  . Sexual activity: Not Currently   Other Topics Concern  . Not on file   Social History Narrative   Lives alone    Family History  Problem Relation Age of Onset  . Heart disease Mother 56       CHF  . Heart disease Father 109       heart attack  . Cancer Sister        premenopausal breast  cancer  . Depression Brother 41       committed suicide     Outpatient Encounter Prescriptions as of 06/02/2017  Medication Sig  . b complex vitamins tablet Take 1 tablet by mouth daily.  . Calcium Carbonate-Vitamin D (CALCIUM-VITAMIN D) 500-200 MG-UNIT tablet Take 1 tablet by mouth 2 (two) times daily.  . cholecalciferol (VITAMIN D) 1000 units tablet Take 1,000 Units by mouth daily.  Marland Kitchen donepezil (ARICEPT) 5 MG tablet Take 1 tablet (5 mg total) by mouth at bedtime.  . Multiple Minerals-Vitamins (CALCIUM & VIT D3 BONE HEALTH PO) Take by mouth.  . vitamin C (ASCORBIC ACID) 500 MG tablet Take 500 mg by mouth daily.  . vitamin E 200 UNIT capsule Take 200 Units by mouth daily.   No facility-administered encounter medications on file as of 06/02/2017.           Objective:   Physical Exam  Constitutional: She is oriented to person, place, and time. She appears well-developed and well-nourished.  HENT:  Head: Normocephalic and atraumatic.  Eyes: Conjunctivae and EOM are normal.  Cardiovascular: Normal rate.  Pulmonary/Chest: Effort normal.  Neurological: She is alert and oriented to person, place, and time.  Skin: Skin is dry. No pallor.  Psychiatric: She has a normal mood and affect. Her behavior is normal.  Vitals reviewed.         Assessment & Plan:  Dementia-would like to get MRI at some point just to further differentiate if this may be more vascular dementia which is what I suspect.  She plans to get the MRI done in January. Discussed that her mental status exam score didn't change significantly from about 2 years ago but her daughter-in-law does feel like there has been a decline and I have even noticed on her office visits that she is repeating herself much more and having difficulty focusing on the current conversation. Discussed the option of starting Aricept. We'll start with 5 mg and see her back in 4-6 weeks. If she is doing well at that point pressure to 10 mg which is  the most effective dose. Monitor blood pressure at that time.  09/2014: MMSE score 24/30 (passing would be 27).  04/2015: MMSE score 18/30.  06/02/2017 score of 21/30  Time spent 25 minutes, greater than 50% of the time spent counseling about dementia and treatment options.

## 2017-06-30 ENCOUNTER — Ambulatory Visit: Payer: Medicare HMO | Admitting: Family Medicine

## 2017-06-30 ENCOUNTER — Encounter: Payer: Self-pay | Admitting: Family Medicine

## 2017-06-30 VITALS — BP 135/69 | HR 66 | Ht 64.0 in | Wt 144.0 lb

## 2017-06-30 DIAGNOSIS — G301 Alzheimer's disease with late onset: Secondary | ICD-10-CM

## 2017-06-30 DIAGNOSIS — I4891 Unspecified atrial fibrillation: Secondary | ICD-10-CM | POA: Diagnosis not present

## 2017-06-30 DIAGNOSIS — F0280 Dementia in other diseases classified elsewhere without behavioral disturbance: Secondary | ICD-10-CM

## 2017-06-30 MED ORDER — METOPROLOL SUCCINATE ER 25 MG PO TB24: 25.0000 mg | ORAL_TABLET | Freq: Every day | ORAL | 2 refills | Status: DC

## 2017-06-30 MED ORDER — APIXABAN 5 MG PO TABS: 5.0000 mg | ORAL_TABLET | Freq: Two times a day (BID) | ORAL | 3 refills | Status: DC

## 2017-06-30 NOTE — Progress Notes (Signed)
Subjective:    Patient ID: Evelyn Greer, female    DOB: 10/22/1933, 83 y.o.   MRN: [id number]  HPI 83 year old female is here today to follow-up for late onset dementia.  She is here today with her daughter-in-law.  We had recently had a discussion about starting her on Aricept.  She is been very repetitive and forgetful.  We started her on 5 mg.  We have also discussed getting an MRI but she wanted to wait until January just to look for other causes such as tumor growth or signs of vascular dementia.  She has been tolerating the Aricept well without any side effects.  No nausea headaches dizziness.  Review of Systems She denies any shortness of breath lightheadedness or dizziness today.  BP 135/69   Pulse 66   Ht 5\' 4"  (1.626 m)   Wt 144 lb (65.3 kg)   SpO2 100%   BMI 24.72 kg/m     Allergies  Allergen Reactions  . Atorvastatin     REACTION: Pain in legs    Past Medical History:  Diagnosis Date  . Hearing loss    sensorineural  . Hyperlipidemia   . Hypertension   . Multiple thyroid nodules    nodules 2009 last U/S    Past Surgical History:  Procedure Laterality Date  . ABDOMINAL HYSTERECTOMY     TAH w/o oophrectomy for fibroids    Social History   Socioeconomic History  . Marital status: Single    Spouse name: Not on file  . Number of children: Not on file  . Years of education: Not on file  . Highest education level: Not on file  Social Needs  . Financial resource strain: Not on file  . Food insecurity - worry: Not on file  . Food insecurity - inability: Not on file  . Transportation needs - medical: Not on file  . Transportation needs - non-medical: Not on file  Occupational History  . Occupation: Retired.   Tobacco Use  . Smoking status: Never Smoker  . Smokeless tobacco: Never Used  Substance and Sexual Activity  . Alcohol use: No  . Drug use: No  . Sexual activity: Not Currently  Other Topics Concern  . Not on file  Social History  Narrative   Lives alone    Family History  Problem Relation Age of Onset  . Heart disease Mother 15       CHF  . Heart disease Father 52       heart attack  . Cancer Sister        premenopausal breast cancer  . Depression Brother 23       committed suicide     Outpatient Encounter Medications as of 06/30/2017  Medication Sig  . b complex vitamins tablet Take 1 tablet by mouth daily.  . Calcium Carbonate-Vitamin D (CALCIUM-VITAMIN D) 500-200 MG-UNIT tablet Take 1 tablet by mouth 2 (two) times daily.  . cholecalciferol (VITAMIN D) 1000 units tablet Take 1,000 Units by mouth daily.  Marland Kitchen donepezil (ARICEPT) 5 MG tablet Take 1 tablet (5 mg total) by mouth at bedtime.  . Multiple Minerals-Vitamins (CALCIUM & VIT D3 BONE HEALTH PO) Take by mouth.  . vitamin C (ASCORBIC ACID) 500 MG tablet Take 500 mg by mouth daily.  . vitamin E 200 UNIT capsule Take 200 Units by mouth daily.  Marland Kitchen apixaban (ELIQUIS) 5 MG TABS tablet Take 1 tablet (5 mg total) 2 (two) times daily by mouth.  . metoprolol  succinate (TOPROL-XL) 25 MG 24 hr tablet Take 1 tablet (25 mg total) daily by mouth.   No facility-administered encounter medications on file as of 06/30/2017.          Objective:   Physical Exam  Constitutional: She is oriented to person, place, and time. She appears well-developed and well-nourished.  HENT:  Head: Normocephalic and atraumatic.  Right Ear: External ear normal.  Left Ear: External ear normal.  Cardiovascular: Normal rate and normal heart sounds.  Irregular tachyarrhythmia.   Pulmonary/Chest: Effort normal and breath sounds normal.  Neurological: She is alert and oriented to person, place, and time.  Skin: Skin is warm and dry.  Psychiatric: She has a normal mood and affect. Her behavior is normal.       Assessment & Plan:  Late onset Alzheimer's-.  Aricept 5 mg well.  No sign of elevated blood pressure readings.  We will go ahead and try to pressure to 10 mg.  Due for CMP and  lipid panel next month.  Lab slip provided.  Follow-up in 3 months.  New onset atrial fibrillation-EKG shows atrial fibrillation with a ventricular rate of 69 bpm.  Chads vas score of 3 ( age, female) which is considered high risk.  Recommendation is for anticoagulation.    Regularly irregular rhythm-EKG performed today.  Trips and sent for Eliquis.  We will also put her on a low-dose metoprolol and refer her to cardiology for further evaluation.  She is completely asymptomatic today in regards to the fact that she denies any lightheadedness dizziness or even shortness of breath.  Her pulse ox is normal and her blood pressure is relatively normal.

## 2017-07-01 LAB — COMPLETE METABOLIC PANEL WITH GFR
AG RATIO: 1.7 (calc) (ref 1.0–2.5)
ALBUMIN MSPROF: 4.3 g/dL (ref 3.6–5.1)
ALT: 20 U/L (ref 6–29)
AST: 27 U/L (ref 10–35)
Alkaline phosphatase (APISO): 57 U/L (ref 33–130)
BILIRUBIN TOTAL: 0.6 mg/dL (ref 0.2–1.2)
BUN: 9 mg/dL (ref 7–25)
CHLORIDE: 107 mmol/L (ref 98–110)
CO2: 24 mmol/L (ref 20–32)
Calcium: 9.2 mg/dL (ref 8.6–10.4)
Creat: 0.86 mg/dL (ref 0.60–0.88)
GFR, EST AFRICAN AMERICAN: 73 mL/min/{1.73_m2} (ref >=60)
GFR, Est Non African American: 63 mL/min/{1.73_m2} (ref >=60)
GLUCOSE: 96 mg/dL (ref 65–99)
Globulin: 2.6 g/dL (calc) (ref 1.9–3.7)
POTASSIUM: 3.5 mmol/L (ref 3.5–5.3)
Sodium: 142 mmol/L (ref 135–146)
Total Protein: 6.9 g/dL (ref 6.1–8.1)

## 2017-07-01 LAB — TSH: TSH: 2.15 m[IU]/L (ref 0.40–4.50)

## 2017-07-01 LAB — CBC
HEMATOCRIT: 40.5 % (ref 35.0–45.0)
Hemoglobin: 13.8 g/dL (ref 11.7–15.5)
MCH: 29.2 pg (ref 27.0–33.0)
MCHC: 34.1 g/dL (ref 32.0–36.0)
MCV: 85.8 fL (ref 80.0–100.0)
MPV: 11.6 fL (ref 7.5–12.5)
Platelets: 193 10*3/uL (ref 140–400)
RBC: 4.72 10*6/uL (ref 3.80–5.10)
RDW: 12.4 % (ref 11.0–15.0)
WBC: 6.7 10*3/uL (ref 3.8–10.8)

## 2017-07-11 ENCOUNTER — Ambulatory Visit: Payer: Medicare HMO | Admitting: Cardiology

## 2017-07-12 ENCOUNTER — Telehealth: Payer: Self-pay

## 2017-07-12 MED ORDER — DONEPEZIL HCL 10 MG PO TABS: 10.0000 mg | ORAL_TABLET | Freq: Every day | ORAL | 3 refills | Status: DC

## 2017-07-12 NOTE — Telephone Encounter (Signed)
Evelyn Greer needs the increase dose of Aricept to 10 mg once daily. Is it ok to fill?

## 2017-07-12 NOTE — Telephone Encounter (Signed)
rx sent

## 2017-07-14 ENCOUNTER — Ambulatory Visit: Payer: Medicare HMO | Admitting: Family Medicine

## 2017-07-18 ENCOUNTER — Ambulatory Visit: Payer: Medicare HMO | Admitting: Cardiology

## 2017-07-18 ENCOUNTER — Encounter: Payer: Self-pay | Admitting: Cardiology

## 2017-07-18 VITALS — BP 122/84 | HR 82 | Ht 64.0 in | Wt 143.0 lb

## 2017-07-18 DIAGNOSIS — I7121 Aneurysm of the ascending aorta, without rupture: Secondary | ICD-10-CM

## 2017-07-18 DIAGNOSIS — I491 Atrial premature depolarization: Secondary | ICD-10-CM | POA: Diagnosis not present

## 2017-07-18 DIAGNOSIS — I712 Thoracic aortic aneurysm, without rupture: Secondary | ICD-10-CM | POA: Diagnosis not present

## 2017-07-18 MED ORDER — ASPIRIN EC 81 MG PO TBEC: 81.0000 mg | DELAYED_RELEASE_TABLET | Freq: Every day | ORAL | 3 refills | Status: DC

## 2017-07-18 NOTE — Addendum Note (Signed)
Addended by: Mattie Marlin on: 07/18/2017 10:32 AM   Modules accepted: Orders

## 2017-07-18 NOTE — Progress Notes (Signed)
Cardiology Office Note:    Date:  07/18/2017   ID:  [name], DOB 19-Jul-1934, MRN [id number]  PCP:  Hali Marry, MD  Cardiologist:  Jenean Lindau, MD   Referring MD: Hali Marry, *    ASSESSMENT:    1. Ascending aortic aneurysm ([place])   2. Atrial premature beats    PLAN:    In order of problems listed above:  1. The patient's EKG is unremarkable.  It reveals sinus rhythm with multiple PACs.  I also repeated an EKG to be sure and a rhythm strip and this confirms sinus rhythm.  In view of this I mentioned to the patient that there was no indication for anticoagulation.  The patient has a very supportive family and they have not initiated anticoagulation.  I advised him to start enteric-coated aspirin 81 mg if there was no contraindication. 2. Echocardiogram will be done to assess murmur heard on auscultation. 3. I also mentioned to the patient that in view of atherosclerotic vascular disease and a history of aortic aneurysm which is followed by the primary care physician, the patient needs to be evaluated for a statin therapy.  They will discuss this with her primary care physician. 4. The patient will be seen in follow-up on a as needed basis only.   Medication Adjustments/Labs and Tests Ordered: Current medicines are reviewed at length with the patient today.  Concerns regarding medicines are outlined above.  No orders of the defined types were placed in this encounter.  No orders of the defined types were placed in this encounter.    History of Present Illness:    Evelyn Greer is a 83 y.o. female who is being seen today for the evaluation of atrial arrhythmia at the request of Hali Marry, *.  Patient is a pleasant 83 year old female.  She is accompanied by her daughter-in-law.  The patient has a history of essential hypertension, ascending aortic aneurysm and dementia.  She mentions to me that she was referred here because of  abnormal EKG.  She leads a sedentary lifestyle.  She denies any chest pain orthopnea or PND.  She denies any palpitations or tachycardia back symptoms.  At the time of my evaluation, the patient is alert awake oriented and in no distress.  She was found to have an abnormal EKG and referred here.  Past Medical History:  Diagnosis Date  . Hearing loss    sensorineural  . Hyperlipidemia   . Hypertension   . Multiple thyroid nodules    nodules 2009 last U/S    Past Surgical History:  Procedure Laterality Date  . ABDOMINAL HYSTERECTOMY     TAH w/o oophrectomy for fibroids    Current Medications: Current Meds  Medication Sig  . b complex vitamins tablet Take 1 tablet by mouth daily.  . Calcium Carbonate-Vitamin D (CALCIUM-VITAMIN D) 500-200 MG-UNIT tablet Take 1 tablet by mouth 2 (two) times daily.  . cholecalciferol (VITAMIN D) 1000 units tablet Take 1,000 Units by mouth daily.  Marland Kitchen donepezil (ARICEPT) 10 MG tablet Take 1 tablet (10 mg total) by mouth at bedtime.  . Multiple Minerals-Vitamins (CALCIUM & VIT D3 BONE HEALTH PO) Take by mouth.  . vitamin C (ASCORBIC ACID) 500 MG tablet Take 500 mg by mouth daily.  . vitamin E 200 UNIT capsule Take 200 Units by mouth daily.     Allergies:   Niacin and related and Atorvastatin   Social History   Socioeconomic History  .  Marital status: Single    Spouse name: None  . Number of children: None  . Years of education: None  . Highest education level: None  Social Needs  . Financial resource strain: None  . Food insecurity - worry: None  . Food insecurity - inability: None  . Transportation needs - medical: None  . Transportation needs - non-medical: None  Occupational History  . Occupation: Retired.   Tobacco Use  . Smoking status: Never Smoker  . Smokeless tobacco: Never Used  Substance and Sexual Activity  . Alcohol use: No  . Drug use: No  . Sexual activity: Not Currently  Other Topics Concern  . None  Social History  Narrative   Lives alone     Family History: The patient's family history includes Cancer in her sister; Depression (age of onset: 24) in her brother; Heart disease (age of onset: 85) in her father and mother.  ROS:   Please see the history of present illness.    All other systems reviewed and are negative.  EKGs/Labs/Other Studies Reviewed:    The following studies were reviewed today: I reviewed the office records by the primary physician discussed this with the patient extensively   Recent Labs: 06/30/2017: ALT 20; BUN 9; Creat 0.86; Hemoglobin 13.8; Platelets 193; Potassium 3.5; Sodium 142; TSH 2.15  Recent Lipid Panel    Component Value Date/Time   CHOL 253 (H) 05/19/2017 1215   TRIG 85 05/19/2017 1215   HDL 69 05/19/2017 1215   CHOLHDL 3.7 05/19/2017 1215   VLDL 15 12/15/2015 1106   LDLCALC 147 (H) 12/15/2015 1106    Physical Exam:    VS:  BP 122/84 (BP Location: Left Arm, Patient Position: Sitting, Cuff Size: Normal)   Pulse 82   Ht 5\' 4"  (1.626 m)   Wt 143 lb (64.9 kg)   SpO2 98%   BMI 24.55 kg/m     Wt Readings from Last 3 Encounters:  07/18/17 143 lb (64.9 kg)  06/30/17 144 lb (65.3 kg)  06/02/17 145 lb (65.8 kg)     GEN: Patient is in no acute distress HEENT: Normal NECK: No JVD; No carotid bruits LYMPHATICS: No lymphadenopathy CARDIAC: S1 S2 regular, 2/6 systolic murmur at the apex. RESPIRATORY:  Clear to auscultation without rales, wheezing or rhonchi  ABDOMEN: Soft, non-tender, non-distended MUSCULOSKELETAL:  No edema; No deformity  SKIN: Warm and dry NEUROLOGIC:  Alert and oriented x 3 PSYCHIATRIC:  Normal affect    Signed, Jenean Lindau, MD  07/18/2017 10:16 AM    [place]

## 2017-07-18 NOTE — Addendum Note (Signed)
Addended by: Mattie Marlin on: 07/18/2017 10:33 AM   Modules accepted: Orders

## 2017-07-18 NOTE — Patient Instructions (Signed)
Medication Instructions:  Your physician has recommended you make the following change in your medication:  DO NOT START eliquis START 81 mg enteric coated baby aspirin daily  Labwork: None  Testing/Procedures: Your physician has requested that you have an echocardiogram. Echocardiography is a painless test that uses sound waves to create images of your heart. It provides your doctor with information about the size and shape of your heart and how well your heart's chambers and valves are working. This procedure takes approximately one hour. There are no restrictions for this procedure.   Follow-Up: Your physician recommends that you schedule a follow-up appointment in: As needed   Any Other Special Instructions Will Be Listed Below (If Applicable).     If you need a refill on your cardiac medications before your next appointment, please call your pharmacy.   [place], RN, BSN

## 2017-08-05 ENCOUNTER — Ambulatory Visit (HOSPITAL_BASED_OUTPATIENT_CLINIC_OR_DEPARTMENT_OTHER)
Admission: RE | Admit: 2017-08-05 | Discharge: 2017-08-05 | Disposition: A | Discharge status: Home / Self Care | Payer: Medicare HMO | Source: Ambulatory Visit | Attending: Cardiology | Admitting: Cardiology

## 2017-08-05 DIAGNOSIS — I491 Atrial premature depolarization: Secondary | ICD-10-CM | POA: Diagnosis not present

## 2017-08-05 DIAGNOSIS — I10 Essential (primary) hypertension: Secondary | ICD-10-CM | POA: Insufficient documentation

## 2017-08-05 DIAGNOSIS — I080 Rheumatic disorders of both mitral and aortic valves: Secondary | ICD-10-CM | POA: Diagnosis not present

## 2017-08-05 NOTE — Progress Notes (Signed)
Echocardiogram 2D Echocardiogram has been performed.  Evelyn Greer 08/05/2017, 11:54 AM

## 2017-08-18 ENCOUNTER — Other Ambulatory Visit: Payer: Self-pay | Admitting: Family Medicine

## 2017-08-18 DIAGNOSIS — R030 Elevated blood-pressure reading, without diagnosis of hypertension: Secondary | ICD-10-CM

## 2017-08-18 NOTE — Progress Notes (Signed)
Labs ordered per radiology protocol  

## 2017-08-31 DIAGNOSIS — R030 Elevated blood-pressure reading, without diagnosis of hypertension: Secondary | ICD-10-CM | POA: Diagnosis not present

## 2017-08-31 LAB — COMPLETE METABOLIC PANEL WITH GFR
AG Ratio: 1.6 (calc) (ref 1.0–2.5)
ALKALINE PHOSPHATASE (APISO): 54 U/L (ref 33–130)
ALT: 16 U/L (ref 6–29)
AST: 22 U/L (ref 10–35)
Albumin: 4.1 g/dL (ref 3.6–5.1)
BILIRUBIN TOTAL: 0.5 mg/dL (ref 0.2–1.2)
BUN/Creatinine Ratio: 14 (calc) (ref 6–22)
BUN: 13 mg/dL (ref 7–25)
CHLORIDE: 104 mmol/L (ref 98–110)
CO2: 27 mmol/L (ref 20–32)
Calcium: 9.4 mg/dL (ref 8.6–10.4)
Creat: 0.94 mg/dL — ABNORMAL HIGH (ref 0.60–0.88)
GFR, Est African American: 66 mL/min/{1.73_m2} (ref >=60)
GFR, Est Non African American: 57 mL/min/{1.73_m2} — ABNORMAL LOW (ref >=60)
GLUCOSE: 90 mg/dL (ref 65–99)
Globulin: 2.6 g/dL (calc) (ref 1.9–3.7)
Potassium: 3.9 mmol/L (ref 3.5–5.3)
Sodium: 139 mmol/L (ref 135–146)
Total Protein: 6.7 g/dL (ref 6.1–8.1)

## 2017-09-01 ENCOUNTER — Ambulatory Visit (INDEPENDENT_AMBULATORY_CARE_PROVIDER_SITE_OTHER): Payer: Medicare HMO | Admitting: Family Medicine

## 2017-09-01 ENCOUNTER — Encounter: Payer: Self-pay | Admitting: Family Medicine

## 2017-09-01 VITALS — BP 157/62 | HR 62 | Ht 64.0 in | Wt 141.0 lb

## 2017-09-01 DIAGNOSIS — M21619 Bunion of unspecified foot: Secondary | ICD-10-CM | POA: Diagnosis not present

## 2017-09-01 DIAGNOSIS — M79674 Pain in right toe(s): Secondary | ICD-10-CM

## 2017-09-01 DIAGNOSIS — L97511 Non-pressure chronic ulcer of other part of right foot limited to breakdown of skin: Secondary | ICD-10-CM | POA: Diagnosis not present

## 2017-09-01 MED ORDER — KETOCONAZOLE 2 % EX CREA: 1.0000 "application " | TOPICAL_CREAM | Freq: Every day | CUTANEOUS | 0 refills | Status: DC

## 2017-09-01 NOTE — Progress Notes (Signed)
All labs are normal. 

## 2017-09-01 NOTE — Patient Instructions (Addendum)
For the red spots on your face recommend using a cleansing soap by Cetaphil or pHisoDerm.  Okay to use a small amount of over-the-counter hydrocortisone cream,  just do not get near your eye.  Make sure you wash her hands well after applying.  After your shower please dry between each toe with a cotton towel or washcloth to remove extra moisture.  You can also use a hair dryer on the cool setting to blow off any extra moisture.  Then apply the prescription cream.  I would like to see you back in about 10 days to make sure that it is improving.

## 2017-09-01 NOTE — Progress Notes (Signed)
Subjective:    Patient ID: Evelyn Greer, female    DOB: 15-May-1934, 84 y.o.   MRN: [id number]  HPI C/O of left foot pain for 3-4  of months.   Her daughter-in-law is here with her and says that she is mentioned in a couple times.  She says a few weeks ago they asked her to take off her shoes and socks so that they could look.  Noticed that the bone at the base of the left great toe looked very red and inflamed.  They thought maybe it could be gout and so they called to make an appointment.  They also noticed that the distal foot on the left was more dusky appearing compared to her right foot She doesn't dry between her toes.  She does wear tight fitting shoes as example by the pair that she was wearing today during the office visit.  She says that she is noticed some pain particularly between her toes.  The most of her pain is at the base of the left great toe.  No worsening or alleviating factors.   Review of Systems  BP (!) 157/62   Pulse 62   Ht 5\' 4"  (1.626 m)   Wt 141 lb (64 kg)   SpO2 99%   BMI 24.20 kg/m     Allergies  Allergen Reactions  . Niacin And Related Other (See Comments)    Flush,tingling in legs  . Atorvastatin     REACTION: Pain in legs    Past Medical History:  Diagnosis Date  . Hearing loss    sensorineural  . Hyperlipidemia   . Hypertension   . Multiple thyroid nodules    nodules 2009 last U/S    Past Surgical History:  Procedure Laterality Date  . ABDOMINAL HYSTERECTOMY     TAH w/o oophrectomy for fibroids    Social History   Socioeconomic History  . Marital status: Single    Spouse name: Not on file  . Number of children: Not on file  . Years of education: Not on file  . Highest education level: Not on file  Social Needs  . Financial resource strain: Not on file  . Food insecurity - worry: Not on file  . Food insecurity - inability: Not on file  . Transportation needs - medical: Not on file  . Transportation needs - non-medical:  Not on file  Occupational History  . Occupation: Retired.   Tobacco Use  . Smoking status: Never Smoker  . Smokeless tobacco: Never Used  Substance and Sexual Activity  . Alcohol use: No  . Drug use: No  . Sexual activity: Not Currently  Other Topics Concern  . Not on file  Social History Narrative   Lives alone    Family History  Problem Relation Age of Onset  . Heart disease Mother 11       CHF  . Heart disease Father 68       heart attack  . Cancer Sister        premenopausal breast cancer  . Depression Brother 53       committed suicide     Outpatient Encounter Medications as of 09/01/2017  Medication Sig  . aspirin EC 81 MG tablet Take 1 tablet (81 mg total) by mouth daily.  . Calcium Carbonate-Vitamin D (CALCIUM-VITAMIN D) 500-200 MG-UNIT tablet Take 1 tablet by mouth 2 (two) times daily.  Marland Kitchen donepezil (ARICEPT) 10 MG tablet Take 1 tablet (10 mg total) by  mouth at bedtime.  . metoprolol succinate (TOPROL-XL) 25 MG 24 hr tablet Take 1 tablet (25 mg total) daily by mouth.  . Multiple Minerals-Vitamins (CALCIUM & VIT D3 BONE HEALTH PO) Take by mouth.  . vitamin C (ASCORBIC ACID) 500 MG tablet Take 500 mg by mouth daily.  . vitamin E 200 UNIT capsule Take 200 Units by mouth daily.  Marland Kitchen ketoconazole (NIZORAL) 2 % cream Apply 1 application topically daily.  . [DISCONTINUED] b complex vitamins tablet Take 1 tablet by mouth daily.  . [DISCONTINUED] cholecalciferol (VITAMIN D) 1000 units tablet Take 1,000 Units by mouth daily.   No facility-administered encounter medications on file as of 09/01/2017.          Objective:   Physical Exam  Constitutional: She is oriented to person, place, and time. She appears well-developed and well-nourished.  HENT:  Head: Normocephalic and atraumatic.  Eyes: Conjunctivae and EOM are normal.  Cardiovascular: Normal rate.  Pulmonary/Chest: Effort normal.  Musculoskeletal:  Left foot: skin appears slightly dusky over the toes and skin is  cool to tough.  She has some thick white maceration between the fourth and fifth toes on her left foot with a distinct odor.  In between the great toe and the second toe as well as the second and third toes she has small ulcerated scabs approximately 3-4 mm in size.  She has an erythematous bunion at the great toe on the left foot. DP pulse is 2+ Right foot: Normal color and appearance.  The skin is cool to touch.  She actually does have an ulceration between the second and third toes on the right foot and has a smaller bunion on the right foot as well.  Neurological: She is alert and oriented to person, place, and time.  Skin: Skin is dry. No pallor.  Psychiatric: She has a normal mood and affect. Her behavior is normal.  Vitals reviewed.             Assessment & Plan:  Right great toe pain -she does have some erythema as noted in the picture above over the first MTP joint.  This just looks more suspicious for pressure on the skin.  She definitely has a bunion.  I did not appreciate any bogginess of the joint.  I do not suspect gout but will go ahead and just check a uric acid.  She does have some duskiness of the toes compared to her right foot.  But I was able to palpate a dorsal pedal pulse which is very reassuring.  Both of her feet were cold to touch.  Her shoe wear is definitely too tight and we discussed getting shoes with a wider toe box may be more of a low for.  She also looks like she has some splinter hemorrhages under her great toenails.  Again not sure if this may just be pressure related from the shoe pinching on her foot.  She also had some skin ulcerations between several toes where they are rubbing together.  Reminded her to make sure that when she gets out of the shower she dries between her toes and again needs a wider shoe so that the toes aren't being pushed together  Yeast infection of skin -she does have a candidal skin infection between the fourth and fifth toes on the  left foot.  There was a distinct odor on exam.  Will treat with terconazole cream.  I will have her apply this after she dries between the toes  and I would like to see her back in about 10 days to make sure that her feet are improving.

## 2017-09-02 ENCOUNTER — Encounter: Payer: Self-pay | Admitting: Family Medicine

## 2017-09-02 LAB — CBC
HEMATOCRIT: 39.8 % (ref 35.0–45.0)
Hemoglobin: 13.8 g/dL (ref 11.7–15.5)
MCH: 29.4 pg (ref 27.0–33.0)
MCHC: 34.7 g/dL (ref 32.0–36.0)
MCV: 84.9 fL (ref 80.0–100.0)
MPV: 11.3 fL (ref 7.5–12.5)
Platelets: 187 10*3/uL (ref 140–400)
RBC: 4.69 10*6/uL (ref 3.80–5.10)
RDW: 12.4 % (ref 11.0–15.0)
WBC: 6.4 10*3/uL (ref 3.8–10.8)

## 2017-09-02 LAB — URIC ACID: Uric Acid, Serum: 4.8 mg/dL (ref 2.5–7.0)

## 2017-09-02 LAB — SEDIMENTATION RATE: SED RATE: 2 mm/h (ref 0–30)

## 2017-09-03 ENCOUNTER — Ambulatory Visit (HOSPITAL_BASED_OUTPATIENT_CLINIC_OR_DEPARTMENT_OTHER): Payer: Medicare HMO

## 2017-09-10 ENCOUNTER — Ambulatory Visit (HOSPITAL_BASED_OUTPATIENT_CLINIC_OR_DEPARTMENT_OTHER)
Admission: RE | Admit: 2017-09-10 | Discharge: 2017-09-10 | Disposition: A | Discharge status: Home / Self Care | Payer: Medicare HMO | Source: Ambulatory Visit | Attending: Family Medicine | Admitting: Family Medicine

## 2017-09-10 ENCOUNTER — Encounter (HOSPITAL_BASED_OUTPATIENT_CLINIC_OR_DEPARTMENT_OTHER): Payer: Self-pay

## 2017-09-10 DIAGNOSIS — I6782 Cerebral ischemia: Secondary | ICD-10-CM | POA: Diagnosis not present

## 2017-09-10 DIAGNOSIS — I712 Thoracic aortic aneurysm, without rupture, unspecified: Secondary | ICD-10-CM

## 2017-09-10 DIAGNOSIS — F0390 Unspecified dementia without behavioral disturbance: Secondary | ICD-10-CM | POA: Insufficient documentation

## 2017-09-10 DIAGNOSIS — I517 Cardiomegaly: Secondary | ICD-10-CM | POA: Insufficient documentation

## 2017-09-10 DIAGNOSIS — R918 Other nonspecific abnormal finding of lung field: Secondary | ICD-10-CM

## 2017-09-10 DIAGNOSIS — I700 Atherosclerosis of aorta: Secondary | ICD-10-CM | POA: Insufficient documentation

## 2017-09-10 DIAGNOSIS — I2510 Atherosclerotic heart disease of native coronary artery without angina pectoris: Secondary | ICD-10-CM | POA: Diagnosis not present

## 2017-09-10 MED ORDER — IOPAMIDOL (ISOVUE-370) INJECTION 76%
125.0000 mL | Freq: Once | INTRAVENOUS | Status: AC | PRN
  Administered 2017-09-10: 100 mL via INTRAVENOUS

## 2017-09-12 ENCOUNTER — Ambulatory Visit: Payer: Medicare HMO | Admitting: Family Medicine

## 2017-09-20 ENCOUNTER — Ambulatory Visit (INDEPENDENT_AMBULATORY_CARE_PROVIDER_SITE_OTHER): Payer: Medicare HMO | Admitting: Family Medicine

## 2017-09-20 ENCOUNTER — Encounter: Payer: Self-pay | Admitting: Family Medicine

## 2017-09-20 VITALS — BP 110/68 | HR 68 | Temp 98.4°F | Wt 140.0 lb

## 2017-09-20 DIAGNOSIS — R238 Other skin changes: Secondary | ICD-10-CM | POA: Diagnosis not present

## 2017-09-20 DIAGNOSIS — L97511 Non-pressure chronic ulcer of other part of right foot limited to breakdown of skin: Secondary | ICD-10-CM | POA: Diagnosis not present

## 2017-09-20 DIAGNOSIS — M21619 Bunion of unspecified foot: Secondary | ICD-10-CM

## 2017-09-20 MED ORDER — FLUCONAZOLE 150 MG PO TABS: 150.0000 mg | ORAL_TABLET | Freq: Every day | ORAL | 0 refills | Status: DC

## 2017-09-20 NOTE — Progress Notes (Signed)
   Subjective:    Patient ID: Evelyn Greer, female    DOB: [birth year], 84 y.o.   MRN: [id number]  HPI 84 year old female comes in today to follow-up particularly for an ulcer on the medial side of the fifth toe on her left foot as well as an ulcer on the second toe on the medial side on her left foot.  She has been drying well between her toes after bath and has been applying the terconazole cream at bedtime.  It does not look worse but it really does not look significantly better.  She still complaining about some pain in that foot particularly the more she is up on it.  The daughter-in-law also reports that her husband who is the patient's son and has been applying the cream at night feels like the right foot is always colder than the left.   Review of Systems     Objective:   Physical Exam  Constitutional: She is oriented to person, place, and time. She appears well-developed and well-nourished.  HENT:  Head: Normocephalic and atraumatic.  Eyes: Conjunctivae and EOM are normal.  Cardiovascular: Normal rate.  Pulmonary/Chest: Effort normal.  Musculoskeletal:  dusky appearance of right foot and toes on the left.  She has an ulcer on the 5th toe on the medial side of the left foot and the medial side of 2nd toe on the left foot. She has some erythema between each toe.    Neurological: She is alert and oriented to person, place, and time.  Skin: Skin is dry. No pallor.  Psychiatric: She has a normal mood and affect. Her behavior is normal.  Vitals reviewed.         Assessment & Plan:  2 ulcers on her feet-she also has some stage I ulcer type areas that just look a little red and macerated between her toes on the right and left foot.  Will treat with oral Diflucan for 3 days as were waiting to get in with podiatry.  Again just reminded her to make sure that she is wearing open shoe wear that is not crowding her toes together.  Clearly she is getting these areas in friction  areas.  Dusky appearance of her right foot-we will schedule for ABIs.

## 2017-10-18 ENCOUNTER — Encounter (HOSPITAL_BASED_OUTPATIENT_CLINIC_OR_DEPARTMENT_OTHER): Payer: Medicare HMO

## 2017-11-22 DIAGNOSIS — L570 Actinic keratosis: Secondary | ICD-10-CM | POA: Diagnosis not present

## 2017-11-22 DIAGNOSIS — L820 Inflamed seborrheic keratosis: Secondary | ICD-10-CM | POA: Diagnosis not present

## 2018-01-21 ENCOUNTER — Emergency Department (HOSPITAL_BASED_OUTPATIENT_CLINIC_OR_DEPARTMENT_OTHER)
Admission: EM | Admit: 2018-01-21 | Discharge: 2018-01-21 | Disposition: A | Discharge status: Home / Self Care | Payer: Medicare HMO | Attending: Emergency Medicine | Admitting: Emergency Medicine

## 2018-01-21 ENCOUNTER — Emergency Department (HOSPITAL_BASED_OUTPATIENT_CLINIC_OR_DEPARTMENT_OTHER): Payer: Medicare HMO

## 2018-01-21 ENCOUNTER — Encounter (HOSPITAL_BASED_OUTPATIENT_CLINIC_OR_DEPARTMENT_OTHER): Payer: Self-pay | Admitting: Emergency Medicine

## 2018-01-21 ENCOUNTER — Other Ambulatory Visit: Payer: Self-pay

## 2018-01-21 DIAGNOSIS — I10 Essential (primary) hypertension: Secondary | ICD-10-CM | POA: Insufficient documentation

## 2018-01-21 DIAGNOSIS — Z85048 Personal history of other malignant neoplasm of rectum, rectosigmoid junction, and anus: Secondary | ICD-10-CM | POA: Diagnosis not present

## 2018-01-21 DIAGNOSIS — Z79899 Other long term (current) drug therapy: Secondary | ICD-10-CM | POA: Insufficient documentation

## 2018-01-21 DIAGNOSIS — Z7982 Long term (current) use of aspirin: Secondary | ICD-10-CM | POA: Diagnosis not present

## 2018-01-21 DIAGNOSIS — R0789 Other chest pain: Secondary | ICD-10-CM | POA: Diagnosis not present

## 2018-01-21 DIAGNOSIS — K8020 Calculus of gallbladder without cholecystitis without obstruction: Secondary | ICD-10-CM | POA: Diagnosis not present

## 2018-01-21 DIAGNOSIS — R109 Unspecified abdominal pain: Secondary | ICD-10-CM | POA: Diagnosis not present

## 2018-01-21 DIAGNOSIS — R1011 Right upper quadrant pain: Secondary | ICD-10-CM | POA: Diagnosis not present

## 2018-01-21 LAB — CBC WITH DIFFERENTIAL/PLATELET
BASOS PCT: 0 %
Basophils Absolute: 0 10*3/uL (ref 0.0–0.1)
EOS ABS: 0.1 10*3/uL (ref 0.0–0.7)
Eosinophils Relative: 1 %
HCT: 42.8 % (ref 36.0–46.0)
HEMOGLOBIN: 14.3 g/dL (ref 12.0–15.0)
LYMPHS ABS: 1.3 10*3/uL (ref 0.7–4.0)
Lymphocytes Relative: 19 %
MCH: 29.5 pg (ref 26.0–34.0)
MCHC: 33.4 g/dL (ref 30.0–36.0)
MCV: 88.4 fL (ref 78.0–100.0)
Monocytes Absolute: 0.5 10*3/uL (ref 0.1–1.0)
Monocytes Relative: 7 %
NEUTROS PCT: 73 %
Neutro Abs: 5.2 10*3/uL (ref 1.7–7.7)
Platelets: 186 10*3/uL (ref 150–400)
RBC: 4.84 MIL/uL (ref 3.87–5.11)
RDW: 13.3 % (ref 11.5–15.5)
WBC: 7.1 10*3/uL (ref 4.0–10.5)

## 2018-01-21 LAB — URINALYSIS, ROUTINE W REFLEX MICROSCOPIC
Bilirubin Urine: NEGATIVE
Glucose, UA: NEGATIVE mg/dL
Hgb urine dipstick: NEGATIVE
Ketones, ur: NEGATIVE mg/dL
Nitrite: NEGATIVE
PH: 6 (ref 5.0–8.0)
Protein, ur: NEGATIVE mg/dL

## 2018-01-21 LAB — TROPONIN I

## 2018-01-21 LAB — COMPREHENSIVE METABOLIC PANEL
ALBUMIN: 4.1 g/dL (ref 3.5–5.0)
ALK PHOS: 58 U/L (ref 38–126)
ALT: 19 U/L (ref 14–54)
ANION GAP: 7 (ref 5–15)
AST: 32 U/L (ref 15–41)
BUN: 13 mg/dL (ref 6–20)
CALCIUM: 9.2 mg/dL (ref 8.9–10.3)
CO2: 28 mmol/L (ref 22–32)
Chloride: 104 mmol/L (ref 101–111)
Creatinine, Ser: 0.93 mg/dL (ref 0.44–1.00)
GFR calc Af Amer: >60 mL/min (ref >60)
GFR calc non Af Amer: 56 mL/min — ABNORMAL LOW (ref >60)
GLUCOSE: 100 mg/dL — AB (ref 65–99)
POTASSIUM: 3.7 mmol/L (ref 3.5–5.1)
SODIUM: 139 mmol/L (ref 135–145)
TOTAL PROTEIN: 7.6 g/dL (ref 6.5–8.1)
Total Bilirubin: 0.6 mg/dL (ref 0.3–1.2)

## 2018-01-21 LAB — URINALYSIS, MICROSCOPIC (REFLEX)

## 2018-01-21 LAB — LIPASE, BLOOD: Lipase: 50 U/L (ref 11–51)

## 2018-01-21 MED ORDER — SODIUM CHLORIDE 0.9 % IV SOLN
INTRAVENOUS | Status: DC
  Administered 2018-01-21: 12:00:00 via INTRAVENOUS

## 2018-01-21 MED ORDER — ONDANSETRON HCL 4 MG/2ML IJ SOLN
4.0000 mg | Freq: Once | INTRAMUSCULAR | Status: AC
  Administered 2018-01-21: 4 mg via INTRAVENOUS
  Filled 2018-01-21: qty 2

## 2018-01-21 MED ORDER — MORPHINE SULFATE (PF) 2 MG/ML IV SOLN
2.0000 mg | Freq: Once | INTRAVENOUS | Status: AC
  Administered 2018-01-21: 2 mg via INTRAVENOUS
  Filled 2018-01-21: qty 1

## 2018-01-21 MED ORDER — IOPAMIDOL (ISOVUE-300) INJECTION 61%
100.0000 mL | Freq: Once | INTRAVENOUS | Status: AC | PRN
  Administered 2018-01-21: 100 mL via INTRAVENOUS

## 2018-01-21 NOTE — ED Notes (Signed)
Pt insisted on using restroom prior to getting EKG

## 2018-01-21 NOTE — ED Notes (Signed)
Warm compress applied to pt R arm

## 2018-01-21 NOTE — Discharge Instructions (Addendum)
Try taking over the counter ibuprofen or tylenol to help with the pain, return to the ED if you start having trouble with fever or vomiting, follow up with your doctor next week to be rechecked if the symptoms have not resolved

## 2018-01-21 NOTE — ED Notes (Signed)
Patient transported to CT 

## 2018-01-21 NOTE — ED Provider Notes (Signed)
[facility] EMERGENC[facility] DEPARTMENT Provider Note   CSN: [id number] Arrival date & time: 01/21/18  1033     History   Chief Complaint Chief Complaint  Patient presents with  . Abdominal Pain    HPI Evelyn Greer is a 84 y.o. female.  HPI Patient presents to the emergency room for evaluation of right upper quadrant abdominal pain.  Patient told her family members that she was having some symptoms for the last several days.  This morning she mentioned she was having pain and again and it was more severe.  She denies any trouble with nausea vomiting or diarrhea.  Pain does increase with deep breathing.  Patient denies any trouble with cough or chest pain.  No dysuria.  No frequency or urgency.  She is concerned that she might be having trouble with her gallbladder. Past Medical History:  Diagnosis Date  . Hearing loss    sensorineural  . Hyperlipidemia   . Hypertension   . Multiple thyroid nodules    nodules 2009 last U/S    Patient Active Problem List   Diagnosis Date Noted  . Atrial premature beats 07/18/2017  . Rectal cancer ([place]) 11/29/2016  . Adenomatous rectal polyp 10/16/2016  . Pulmonary nodules 08/24/2016  . Ascending aortic aneurysm ([place]) 08/24/2016  . Dementia 08/24/2016  . Other and combined forms of senile cataract 05/06/2016  . Mild atherosclerosis of carotid artery 11/18/2014  . OSTEOPENIA 01/08/2010  . CARDIAC ARRHYTHMIA 11/27/2009  . ROSACEA 11/27/2009  . POSTMENOPAUSAL STATUS 11/27/2009  . Hyperlipidemia 09/27/2009  . Nontoxic uninodular goiter 09/26/2009  . ELEVATED BLOOD PRESSURE 09/26/2009    Past Surgical History:  Procedure Laterality Date  . ABDOMINAL HYSTERECTOMY     TAH w/o oophrectomy for fibroids     OB History   None      Home Medications    Prior to Admission medications   Medication Sig Start Date End Date Taking? Authorizing Provider  aspirin EC 81 MG tablet Take 1 tablet (81 mg total) by mouth daily. 07/18/17    Revankar, Reita Cliche, MD  Calcium Carbonate-Vitamin D (CALCIUM-VITAMIN D) 500-200 MG-UNIT tablet Take 1 tablet by mouth 2 (two) times daily.    [provider]  donepezil (ARICEPT) 10 MG tablet Take 1 tablet (10 mg total) by mouth at bedtime. 07/12/17   Hali Marry, MD  fluconazole (DIFLUCAN) 150 MG tablet Take 1 tablet (150 mg total) by mouth daily. 09/20/17   Hali Marry, MD  ketoconazole (NIZORAL) 2 % cream Apply 1 application topically daily. 09/01/17   Hali Marry, MD  metoprolol succinate (TOPROL-XL) 25 MG 24 hr tablet Take 1 tablet (25 mg total) daily by mouth. 06/30/17   Hali Marry, MD  Multiple Minerals-Vitamins (CALCIUM & VIT D3 BONE HEALTH PO) Take by mouth.    [provider]  vitamin C (ASCORBIC ACID) 500 MG tablet Take 500 mg by mouth daily.    [provider]  vitamin E 200 UNIT capsule Take 200 Units by mouth daily.    [provider]    Family History Family History  Problem Relation Age of Onset  . Heart disease Mother 64       CHF  . Heart disease Father 40       heart attack  . Cancer Sister        premenopausal breast cancer  . Depression Brother 90       committed suicide     Social History  Social History   Tobacco Use  . Smoking status: Never Smoker  . Smokeless tobacco: Never Used  Substance Use Topics  . Alcohol use: No  . Drug use: No     Allergies   Niacin and related and Atorvastatin   Review of Systems Review of Systems  All other systems reviewed and are negative.    Physical Exam Updated Vital Signs BP (!) 155/76   Pulse (!) 59   Temp 98.3 F (36.8 C) (Oral)   Resp 19   Ht 1.575 m (5\' 2" )   Wt 59 kg (130 lb)   SpO2 98%   BMI 23.78 kg/m   Physical Exam  Constitutional:  Non-toxic appearance. She does not appear ill. No distress.  HENT:  Head: Normocephalic and atraumatic.  Right Ear: External ear normal.  Left Ear: External ear normal.  Eyes:  Conjunctivae are normal. Right eye exhibits no discharge. Left eye exhibits no discharge. No scleral icterus.  Neck: Neck supple. No tracheal deviation present.  Cardiovascular: Normal rate, regular rhythm and intact distal pulses.  Tenderness palpation overlying the inferior anterior ribs on the right side  Pulmonary/Chest: Effort normal and breath sounds normal. No stridor. No respiratory distress. She has no wheezes. She has no rales.  Abdominal: Soft. Bowel sounds are normal. She exhibits no distension. There is tenderness in the right upper quadrant. There is positive Murphy's sign. There is no rebound and no guarding. No hernia.  Musculoskeletal: She exhibits no edema or tenderness.  Neurological: She is alert. She has normal strength. No cranial nerve deficit (no facial droop, extraocular movements intact, no slurred speech) or sensory deficit. She exhibits normal muscle tone. She displays no seizure activity. Coordination normal.  Skin: Skin is warm and dry. No rash noted.  Psychiatric: She has a normal mood and affect.  Nursing note and vitals reviewed.    ED Treatments / Results  Labs (all labs ordered are listed, but only abnormal results are displayed) Labs Reviewed  COMPREHENSIVE METABOLIC PANEL - Abnormal; Notable for the following components:      Result Value   Glucose, Bld 100 (*)    GFR calc non Af Amer 56 (*)    All other components within normal limits  URINALYSIS, ROUTINE W REFLEX MICROSCOPIC - Abnormal; Notable for the following components:   Color, Urine STRAW (*)    Specific Gravity, Urine <1.005 (*)    Leukocytes, UA TRACE (*)    All other components within normal limits  URINALYSIS, MICROSCOPIC (REFLEX) - Abnormal; Notable for the following components:   Bacteria, UA RARE (*)    All other components within normal limits  LIPASE, BLOOD  CBC WITH DIFFERENTIAL/PLATELET  TROPONIN I    EKG EKG Interpretation  Date/Time:  Saturday January 21 2018 10:48:16  EDT Ventricular Rate:  64 PR Interval:    QRS Duration: 95 QT Interval:  441 QTC Calculation: 455 R Axis:   53 Text Interpretation:  Sinus rhythm Atrial premature complex Minimal ST depression, diffuse leads Baseline wander in lead(s) V6 No old tracing to compare Confirmed by Dorie Rank [phone]) on 01/21/2018 10:52:13 AM   Radiology Dg Chest 2 View  Result Date: 01/21/2018 CLINICAL DATA:  Right upper quadrant pain EXAM: CHEST - 2 VIEW COMPARISON:  09/10/2017 chest CT angiogram FINDINGS: Stable cardiomediastinal silhouette with mild cardiomegaly. No pneumothorax. No pleural effusion. Lungs appear clear, with no acute consolidative airspace disease and no pulmonary edema. Stable mild eventration of the anterior right hemidiaphragm. IMPRESSION: Stable mild  cardiomegaly without pulmonary edema. No active pulmonary disease. Electronically Signed   By: Ilona Sorrel M.D.   On: 01/21/2018 12:02   Ct Abdomen Pelvis W Contrast  Result Date: 01/21/2018 CLINICAL DATA:  Right upper quadrant abdominal pain. EXAM: CT ABDOMEN AND PELVIS WITH CONTRAST TECHNIQUE: Multidetector CT imaging of the abdomen and pelvis was performed using the standard protocol following bolus administration of intravenous contrast. CONTRAST:  119mL ISOVUE-300 IOPAMIDOL (ISOVUE-300) INJECTION 61% COMPARISON:  08/03/2016 CT abdomen/pelvis. FINDINGS: Lower chest: No significant pulmonary nodules or acute consolidative airspace disease. Hepatobiliary: Riedel's lobe configuration of the liver. Previously noted dominant 5.2 cm lateral segment left liver lobe cyst on 08/03/2016 CT study has substantially decreased in size, now 0.7 cm (series 2/image 14). Additional scattered small simple liver cysts, largest 1.7 cm in the posterior right liver lobe. Additional scattered subcentimeter hypodense liver lesions, too small to characterize, not appreciably changed, considered benign. No new liver lesions. Cholelithiasis. No gallbladder distention. No  gallbladder wall thickening. No pericholecystic fluid. No biliary ductal dilatation. CBD diameter 5 mm. Pancreas: No pancreatic mass. Top-normal caliber main pancreatic duct is not appreciably changed. No peripancreatic fluid collections. Spleen: Normal size spleen. Subcentimeter hypodense inferior splenic lesion, too small to characterize, not definitely seen on prior CT. No additional splenic lesions. Adrenals/Urinary Tract: Normal adrenals. Small simple left renal cysts, largest an exophytic 1.3 cm lateral upper left renal simple cyst. No hydronephrosis. Small cystocele. Moderately distended and otherwise normal bladder. Stomach/Bowel: Small hiatal hernia. Otherwise normal nondistended stomach. Normal caliber small bowel with no small bowel wall thickening. Normal appendix. Mild sigmoid diverticulosis, with no large bowel wall thickening or significant pericolonic fat stranding. Vascular/Lymphatic: Atherosclerotic nonaneurysmal abdominal aorta. Patent portal, splenic, hepatic and renal veins. Retroaortic left renal vein. No pathologically enlarged lymph nodes in the abdomen or pelvis. Reproductive: Status post hysterectomy, with no abnormal findings at the vaginal cuff. No adnexal mass. Other: No pneumoperitoneum, ascites or focal fluid collection. Musculoskeletal: No aggressive appearing focal osseous lesions. Moderate thoracolumbar spondylosis. IMPRESSION: 1. No acute abnormality. No evidence of bowel obstruction or acute bowel inflammation. Mild sigmoid diverticulosis, no evidence of acute diverticulitis. 2. Cholelithiasis. No CT findings of acute cholecystitis. No biliary ductal dilatation. 3. Small hiatal hernia. 4. Small cystocele. 5.  Aortic Atherosclerosis (ICD10-I70.0). Electronically Signed   By: Ilona Sorrel M.D.   On: 01/21/2018 13:27    Procedures Procedures (including critical care time)  Medications Ordered in ED Medications  0.9 %  sodium chloride infusion ( Intravenous Stopped 01/21/18  1309)  ondansetron (ZOFRAN) injection 4 mg (4 mg Intravenous Given 01/21/18 1159)  morphine 2 MG/ML injection 2 mg (2 mg Intravenous Given 01/21/18 1201)  iopamidol (ISOVUE-300) 61 % injection 100 mL (100 mLs Intravenous Contrast Given 01/21/18 1235)     Initial Impression / Assessment and Plan / ED Course  I have reviewed the triage vital signs and the nursing notes.  Pertinent labs & imaging results that were available during my care of the patient were reviewed by me and considered in my medical decision making (see chart for details).   Patient presented to the emergency room for evaluation of right upper quadrant pain.  On exam the patient actually had tenderness palpation of the inferior costal margin.  Patient has not had any trouble with her appetite.  No nausea or vomiting.  Symptoms do not change at all or eating or drinking.  Her ED work-up is reassuring.  Blood tests are unremarkable.  Urinalysis does not suggest infection or  hematuria associated with a kidney stone.  Chest x-ray without signs of pneumonia.  Patient CT scan does show evidence of gallstones but no evidence of inflammation.  Considering that her blood tests are normal, there are no symptoms of nausea and there is no relationship to eating or drinking I doubt that her gallstones are symptomatic.  Patient does have some tenderness palpation along the chest wall and I think this may be more costal irritation rather than biliary colic.  I discussed the findings with the patient and her son.  I will have her take over-the-counter medications as needed for pain.  We discussed following up with her primary care doctor and to look for warning signs and precautions regarding symptomatic biliary colic   Final Clinical Impressions(s) / ED Diagnoses   Final diagnoses:  Gallstones  Chest wall pain    ED Discharge Orders    None       Dorie Rank, MD 01/21/18 1355

## 2018-01-21 NOTE — ED Triage Notes (Addendum)
RUQ x 1 week that "went away" and came back today and is worse. Denies n/v/d, pain worse with deep breath

## 2018-01-21 NOTE — ED Notes (Signed)
Patient transported to X-ray 

## 2018-05-22 ENCOUNTER — Ambulatory Visit: Payer: Medicare HMO

## 2018-05-23 ENCOUNTER — Encounter: Payer: Self-pay | Admitting: Family Medicine

## 2018-05-23 ENCOUNTER — Ambulatory Visit (INDEPENDENT_AMBULATORY_CARE_PROVIDER_SITE_OTHER): Payer: Medicare HMO | Admitting: Family Medicine

## 2018-05-23 VITALS — BP 145/70 | HR 73 | Ht 64.0 in | Wt 145.0 lb

## 2018-05-23 DIAGNOSIS — L570 Actinic keratosis: Secondary | ICD-10-CM

## 2018-05-23 DIAGNOSIS — R0789 Other chest pain: Secondary | ICD-10-CM | POA: Diagnosis not present

## 2018-05-23 DIAGNOSIS — F0390 Unspecified dementia without behavioral disturbance: Secondary | ICD-10-CM

## 2018-05-23 DIAGNOSIS — F03B0 Unspecified dementia, moderate, without behavioral disturbance, psychotic disturbance, mood disturbance, and anxiety: Secondary | ICD-10-CM

## 2018-05-23 DIAGNOSIS — I491 Atrial premature depolarization: Secondary | ICD-10-CM | POA: Diagnosis not present

## 2018-05-23 MED ORDER — DONEPEZIL HCL 10 MG PO TABS: 10.0000 mg | ORAL_TABLET | Freq: Every day | ORAL | 3 refills | Status: DC
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
## 2018-08-10 ENCOUNTER — Encounter: Payer: Self-pay | Admitting: Physician Assistant

## 2018-08-10 ENCOUNTER — Ambulatory Visit (INDEPENDENT_AMBULATORY_CARE_PROVIDER_SITE_OTHER): Payer: Medicare HMO | Admitting: Physician Assistant

## 2018-08-10 ENCOUNTER — Ambulatory Visit (INDEPENDENT_AMBULATORY_CARE_PROVIDER_SITE_OTHER): Payer: Medicare HMO

## 2018-08-10 VITALS — BP 135/81 | HR 78 | Temp 98.2°F | Wt 139.0 lb

## 2018-08-10 DIAGNOSIS — M5136 Other intervertebral disc degeneration, lumbar region: Secondary | ICD-10-CM | POA: Diagnosis not present

## 2018-08-10 DIAGNOSIS — M546 Pain in thoracic spine: Secondary | ICD-10-CM | POA: Diagnosis not present

## 2018-08-10 DIAGNOSIS — S199XXA Unspecified injury of neck, initial encounter: Secondary | ICD-10-CM | POA: Diagnosis not present

## 2018-08-10 DIAGNOSIS — I7090 Unspecified atherosclerosis: Secondary | ICD-10-CM

## 2018-08-10 DIAGNOSIS — M8589 Other specified disorders of bone density and structure, multiple sites: Secondary | ICD-10-CM | POA: Diagnosis not present

## 2018-08-10 DIAGNOSIS — M8588 Other specified disorders of bone density and structure, other site: Secondary | ICD-10-CM | POA: Diagnosis not present

## 2018-08-10 DIAGNOSIS — M50321 Other cervical disc degeneration at C4-C5 level: Secondary | ICD-10-CM | POA: Diagnosis not present

## 2018-08-10 DIAGNOSIS — W19XXXA Unspecified fall, initial encounter: Secondary | ICD-10-CM

## 2018-08-10 DIAGNOSIS — M545 Low back pain: Secondary | ICD-10-CM | POA: Diagnosis not present

## 2018-08-10 DIAGNOSIS — I517 Cardiomegaly: Secondary | ICD-10-CM | POA: Diagnosis not present

## 2018-08-10 DIAGNOSIS — R2681 Unsteadiness on feet: Secondary | ICD-10-CM | POA: Diagnosis not present

## 2018-08-10 DIAGNOSIS — M419 Scoliosis, unspecified: Secondary | ICD-10-CM | POA: Diagnosis not present

## 2018-08-10 DIAGNOSIS — S22070A Wedge compression fracture of T9-T10 vertebra, initial encounter for closed fracture: Secondary | ICD-10-CM | POA: Diagnosis not present

## 2018-08-10 MED ORDER — PRAVASTATIN SODIUM 20 MG PO TABS: 20.0000 mg | ORAL_TABLET | Freq: Every day | ORAL | 1 refills | Status: DC

## 2018-08-10 MED ORDER — ASPIRIN EC 81 MG PO TBEC: 81.0000 mg | DELAYED_RELEASE_TABLET | Freq: Every day | ORAL | 3 refills | Status: DC

## 2018-08-10 MED ORDER — NAPROXEN 500 MG PO TABS: 500.0000 mg | ORAL_TABLET | Freq: Two times a day (BID) | ORAL | 0 refills | Status: DC

## 2018-08-10 NOTE — Progress Notes (Signed)
HPI:                                                                Evelyn Greer is a 84 y.o. female who presents to [facility] today for back pain  Son , Dorothyann Peng, assists in providing history today.  Pleasant 84 yo F with PMH late-onset dementia, osteopenia, PACs, HLD presents with 3 weeks of persistent back pain. Patient reports history of a fall on 07/22/18 at a friend's house. She fell from standing in the living room and landed on her oustretched arms and back. She fell on vinyl floor. Fall was witnessed by her son and other family members. Both patient and son deny headstrike. She has been complaining of mid-back pain since that time. Denies saddle numbness, bowel/bladder dysfunction, focal weakness or radicular symptoms. Taking Ibuprofen at bedtime, which is helpful. Reports this is her only fall this year.  Fall Risk  08/10/2018 05/23/2018 05/19/2017 05/19/2017 12/15/2015  Falls in the past year? 1 No Yes No No  Number falls in past yr: 0 - 1 - -  Injury with Fall? 1 - Yes - -  Risk for fall due to : History of fall(s);Other (Comment) - - - -  Follow up Falls prevention discussed - Falls prevention discussed - -    Past Medical History:  Diagnosis Date  . Hearing loss    sensorineural  . Hyperlipidemia   . Hypertension   . Multiple thyroid nodules    nodules 2009 last U/S   Past Surgical History:  Procedure Laterality Date  . ABDOMINAL HYSTERECTOMY     TAH w/o oophrectomy for fibroids   Social History   Tobacco Use  . Smoking status: Never Smoker  . Smokeless tobacco: Never Used  Substance Use Topics  . Alcohol use: No   family history includes Cancer in her sister; Depression (age of onset: 42) in her brother; Heart disease (age of onset: 75) in her father and mother.    ROS: negative except as noted in the HPI  Medications: Current Outpatient Medications  Medication Sig Dispense Refill  . aspirin EC 81 MG  tablet Take 1 tablet (81 mg total) by mouth daily. 90 tablet 3  . Calcium Carbonate-Vitamin D (CALCIUM-VITAMIN D) 500-200 MG-UNIT tablet Take 1 tablet by mouth 2 (two) times daily.    Marland Kitchen donepezil (ARICEPT) 10 MG tablet Take 1 tablet (10 mg total) by mouth at bedtime. 90 tablet 3  . MULTIPLE MINERALS-VITAMINS PO Take by mouth.    . naproxen (NAPROSYN) 500 MG tablet Take 1 tablet (500 mg total) by mouth 2 (two) times daily with a meal for 14 days. 30 tablet 0  . pravastatin (PRAVACHOL) 20 MG tablet Take 1 tablet (20 mg total) by mouth at bedtime. 90 tablet 1  . vitamin C (ASCORBIC ACID) 500 MG tablet Take 500 mg by mouth daily.    . vitamin E 200 UNIT capsule Take 200 Units by mouth daily.     No current facility-administered medications for this visit.    Allergies  Allergen Reactions  . Niacin And Related Other (See Comments)    Flush,tingling in legs  . Atorvastatin     REACTION: Pain in legs  Objective:  BP 135/81   Pulse 78   Temp 98.2 F (36.8 C) (Oral)   Wt 139 lb (63 kg)   BMI 23.86 kg/m  Gen:  alert, not ill-appearing, no distress, appropriate for age 38: head normocephalic without obvious abnormality, conjunctiva and cornea clear, trachea midline Pulm: Normal work of breathing, normal phonation, clear to auscultation bilaterally, no wheezes, rales or rhonchi CV: Normal rate, irregular rhythm, s1 and s2 distinct, no murmurs, clicks or rubs  Neuro: alert and oriented x 3, no tremor MSK: extremities atraumatic, bilateral lower and upper extremity strength 4+/5 and symmetric; normal gait and station Back: mild kyphosis and lumbar scoliosis, tenderness of mid-thoracic area adjacent to the spine, no midline tenderness, no SI joint tenderness Skin: intact, no rashes on exposed skin, no jaundice, no cyanosis Psych: well-groomed, cooperative, good eye contact, euthymic mood, affect mood-congruent, speech is articulate, and thought processes clear and  goal-directed    No results found for this or any previous visit (from the past 72 hour(s)). Dg Cervical Spine Complete  Result Date: 08/10/2018 CLINICAL DATA:  [place] several weeks ago, right upper back pain EXAM: CERVICAL SPINE - COMPLETE 4+ VIEW COMPARISON:  None. FINDINGS: The bones are diffusely osteopenic. There is slight anterolisthesis of C3 on C4 by 2 mm. Degenerative disc disease is present at C4-5, C5-6, and C6-7 levels. At these levels there is loss of disc space, sclerosis, and spurring particularly at the C5-6 level. No prevertebral soft tissue swelling is seen. Moderate foraminal narrowing is present at C5-6. The lung apices are clear. The odontoid process is intact. IMPRESSION: 1. No acute abnormality. 2. Anterolisthesis of C3 on C4 by 2 mm. 3. Degenerative disc disease at C4-5, C5-6, and C6-7 levels. Electronically Signed   By: Ivar Drape M.D.   On: 08/10/2018 15:12   Dg Thoracic Spine W/swimmers  Result Date: 08/10/2018 CLINICAL DATA:  Right-sided low back pain, history of a fall several weeks ago EXAM: THORACIC SPINE - 3 VIEWS COMPARISON:  CT chest of 09/10/2016 FINDINGS: There is slight curvature of the upper thoracic spine convex to the right by 8 degrees. The bones are diffusely osteopenic. There does appear to be a mild compression deformity of T9 vertebral body without evidence of retropulsion. MRI may be helpful to assess for acuity if necessary clinically. No prominent paravertebral soft tissue swelling is seen. Cardiomegaly is stable. IMPRESSION: 1. Apparent mild compression deformity of T9 vertebral body of uncertain age. Consider MRI of the thoracic spine to determine acuity if warranted. 2. Osteopenia. 3. Mild cardiomegaly. Electronically Signed   By: Ivar Drape M.D.   On: 08/10/2018 15:10   Dg Lumbar Spine Complete  Result Date: 08/10/2018 CLINICAL DATA:  Right-sided back pain for 3 weeks, history of fall in early November of 2018 EXAM: [place] 4+ VIEW  COMPARISON:  CT abdomen pelvis of 01/21/2017 FINDINGS: The lumbar vertebrae are in normal alignment with relatively normal intervertebral disc spaces. Mild degenerative disc disease is present in the lower thoracic spine as well as the L1-2 and L2-3 levels. No compression deformity is seen. There is some degenerative change involve the facet joints of L5-S1. The SI joints appear well corticated. Mild curvature of the lumbar spine convex to the left is present upper proximally 11 degrees. IMPRESSION: 1. Normal alignment with no acute abnormality. Mild curvature convex to the left of 11 degrees. 2. Degenerative disc disease in the lower thoracic and upper lumbar spine. Electronically Signed   By: Eddie Dibbles  Alvester Chou M.D.   On: 08/10/2018 15:08      Assessment and Plan: 84 y.o. female with   .Genia was seen today for fall.  Diagnoses and all orders for this visit:  Fall from standing, initial encounter -     DG Thoracic Spine W/Swimmers -     DG Cervical Spine Complete -     DG Lumbar Spine Complete -     Ambulatory referral to Physical Therapy  Acute right-sided thoracic back pain -     DG Thoracic Spine W/Swimmers -     DG Cervical Spine Complete -     DG Lumbar Spine Complete -     naproxen (NAPROSYN) 500 MG tablet; Take 1 tablet (500 mg total) by mouth 2 (two) times daily with a meal for 14 days. -     Ambulatory referral to Physical Therapy  Atherosclerosis -     aspirin EC 81 MG tablet; Take 1 tablet (81 mg total) by mouth daily. -     pravastatin (PRAVACHOL) 20 MG tablet; Take 1 tablet (20 mg total) by mouth at bedtime.  Gait instability -     Ambulatory referral to Physical Therapy  Scoliosis of lumbar spine, unspecified scoliosis type  Compression fracture of T9 vertebra, initial encounter ([place])  Osteopenia of multiple sites   Thoracic back pain X-rays personally reviewed by me and Dr. Dianah Field. T9 compression fracture is new compared to CT chest from 09/10/17 and likely due  to her recent fall Switch from Ibuprofen to Naprosyn. May alternate Tylenol [phone] mg  Falls prevention discussed and referral placed to PT for strengthening Recommended treatment for osteoporosis given osteopenia with traumatic fracture  We also discussed her diagnoses of PAC and atherosclerosis. Reviewed recommendations from Cardiology. She is amenable to baby asa and low-dose statin therapy.   Patient education and anticipatory guidance given Patient agrees with treatment plan Follow-up in 1 month with PCP or sooner as needed if symptoms worsen or fail to improve  Darlyne Russian PA-C

## 2018-08-10 NOTE — Patient Instructions (Addendum)
Plan for back pain: X-rays today Start naprosyn (pain-reliever, anti-inflammatory) 1 tab every 12 hours with a meal for 1-2 weeks Hold off on starting the baby aspirin until completing Naprosyn Stop Ibuprofen and avoid all OTC pain relievers apart from Tylenol while take Naprosyn Can take Tylenol [phone] mg every 8 hours as needed for pain in between doses of Naprosyn Start physical therapy to help with pain and strengthen muscles to prevent future falls  For heart arrhythmia: Cardiologist said this is NOT Atrial Fibrillation and is actually Premature Atrial Contractions Recommended baby aspirin 81 mg and cholesterol lowering medication Start Pravastatin at bedtime

## 2018-08-10 NOTE — Progress Notes (Signed)
X-ray shows a compression fracture of the thoracic vertebra at T9 This is due to decreased bone mass and the fall This does not require any surgery or intervention In addition to managing pain, would recommend starting treatment for osteoporosis There is a new medication called Evenity that can help re-build bone. Its a monthly injection for 12 months. If you want to start this medicine, we will need to check blood work to measure calcium level

## 2018-08-21 ENCOUNTER — Ambulatory Visit: Payer: Medicare HMO | Admitting: Rehabilitative and Restorative Service Providers"

## 2018-09-12 ENCOUNTER — Encounter: Payer: Self-pay | Admitting: Family Medicine

## 2018-09-12 ENCOUNTER — Ambulatory Visit (INDEPENDENT_AMBULATORY_CARE_PROVIDER_SITE_OTHER): Payer: Medicare HMO | Admitting: Family Medicine

## 2018-09-12 VITALS — BP 136/88 | HR 73 | Ht 64.0 in | Wt 136.0 lb

## 2018-09-12 DIAGNOSIS — M810 Age-related osteoporosis without current pathological fracture: Secondary | ICD-10-CM

## 2018-09-12 DIAGNOSIS — F0390 Unspecified dementia without behavioral disturbance: Secondary | ICD-10-CM | POA: Diagnosis not present

## 2018-09-12 DIAGNOSIS — I7121 Aneurysm of the ascending aorta, without rupture: Secondary | ICD-10-CM

## 2018-09-12 DIAGNOSIS — I712 Thoracic aortic aneurysm, without rupture: Secondary | ICD-10-CM | POA: Diagnosis not present

## 2018-09-12 DIAGNOSIS — S22070A Wedge compression fracture of T9-T10 vertebra, initial encounter for closed fracture: Secondary | ICD-10-CM

## 2018-09-12 DIAGNOSIS — M8589 Other specified disorders of bone density and structure, multiple sites: Secondary | ICD-10-CM

## 2018-09-12 DIAGNOSIS — M546 Pain in thoracic spine: Secondary | ICD-10-CM | POA: Diagnosis not present

## 2018-09-12 DIAGNOSIS — F03B0 Unspecified dementia, moderate, without behavioral disturbance, psychotic disturbance, mood disturbance, and anxiety: Secondary | ICD-10-CM

## 2018-09-12 DIAGNOSIS — M5410 Radiculopathy, site unspecified: Secondary | ICD-10-CM

## 2018-09-12 MED ORDER — NAPROXEN 500 MG PO TABS
500.0000 mg | ORAL_TABLET | Freq: Two times a day (BID) | ORAL | 0 refills | Status: AC
Start: 2018-09-12 — End: 2018-09-26

## 2018-09-12 NOTE — Progress Notes (Signed)
Subjective:    CC: F/U compression fracture.    HPI:  85 year old female is here today for follow-up of recent fall and T9 compression fracture.  He has a history of late onset dementia and osteopenia and came in right after Christmas with complaining of 3 weeks of persistent back pain.  She was at a friend's house on December 7 and actually fell from a standing position in the living room and landed on outstretched arms and back.  She did fall on a hard surface, a vinyl floor.  She had been complaining of some mid back pain since that time.  Lying any numbness or radicular symptoms.  X-rays were performed of the cervical, thoracic and lumbar spine which did show a mild compression deformity of the T9 vertebral body.  They did recommend considering an MRI to determine acuity but being that it seems consistent with her symptoms the option was to continue to treat for compression fracture.  She was placed on naproxen for pain control and discussed possibly switching her treatment for osteopenia to osteoporosis.  Says her pain has shifted slightly.  Before it was mid back radiating around to her ribs bilaterally.  Now her pain is a little bit more on the left low back radiating into her left buttock area.  She says it feels more "sore".  She says that when she was taking the naproxen initially it really was helping control her pain.  About 3 days after she stopped it is when she started noticing a soreness in her left low back.  Daughter-in-law has noticed that is her gait seems to be slightly abnormal and that she is been holding onto things while she is walking.  Patient says is because she is afraid she might fall again.  With her known history of atherosclerosis seen on blood vessels on imaging in addition to carotid artery atherosclerosis and aortic a sending aneurysm she was also started on a low-dose aspirin and statin.  He decided not to take either 1 of these medications.  And her daughter-in-law who  is with her had several questions today.  Dementia- no recetn changes in her status. She is on Aricept.   Past medical history, Surgical history, Family history not pertinant except as noted below, Social history, Allergies, and medications have been entered into the medical record, reviewed, and corrections made.   Review of Systems: No fevers, chills, night sweats, weight loss, chest pain, or shortness of breath.   Objective:    General: Well Developed, well nourished, and in no acute distress.  Neuro: Alert and oriented x3, extra-ocular muscles intact, sensation grossly intact.  HEENT: Normocephalic, atraumatic  Skin: Warm and dry, no rashes. Cardiac: Regular rate and rhythm, no murmurs rubs or gallops, no lower extremity edema.  Respiratory: Clear to auscultation bilaterally. Not using accessory muscles, speaking in full sentences. MSK: Left hip with normal range of motion.  Strength is 5-5 at the hips knees and ankles bilaterally.  Patellar reflexes are 0+ bilaterally.  Negative straight leg raise on the left.  Nontender over the lumbar spine.  A little bit tender over the left buttock area.  Nontender over the SI joints.   Impression and Recommendations:    Compression fracture of T9 vertebra -her pain in the vertebral area has actually improved.  She is more concerned now with the left low back pain radiating into the buttock area.  Left low back pain with radiculopathy most consistent with possible sciatica into the buttock area.  Recommend gentle stretching.  Encouraged her to go ahead and start physical therapy.  They had reached out to her but initially they did not schedule.  We will refill the naproxen since that seemed to have helped. PT will help as well.  Weakness on exam of that left leg.  Not improving then consider MRI for further work-up.  Moderate dementia -continue with Aricept.  Continue to follow every 6 months.  Osteopenia with compression fracture.  We discussed  options.  I like for her to consider Prolia.  She will information and handout provided.  Explained that she would need up-to-date labs and normal renal function and we do administered every 6 months.  Aortic ascending aneurysm last measuring 4.3 cm in January 26 of 2019.  Recommendation is for annual CTA or MRA.  Not currently on aspirin or statin therapy.  We discussed the risks and benefits of starting a statin at age 32.  She would prefer not to take any extra medications.

## 2018-09-12 NOTE — Patient Instructions (Signed)
OK to overlap tylenol with the Naproxen Start physical therapy.

## 2018-09-14 DIAGNOSIS — M546 Pain in thoracic spine: Secondary | ICD-10-CM | POA: Diagnosis not present

## 2018-09-14 DIAGNOSIS — F0390 Unspecified dementia without behavioral disturbance: Secondary | ICD-10-CM | POA: Diagnosis not present

## 2018-09-14 DIAGNOSIS — M8589 Other specified disorders of bone density and structure, multiple sites: Secondary | ICD-10-CM | POA: Diagnosis not present

## 2018-09-14 DIAGNOSIS — S22070A Wedge compression fracture of T9-T10 vertebra, initial encounter for closed fracture: Secondary | ICD-10-CM | POA: Diagnosis not present

## 2018-09-14 DIAGNOSIS — I712 Thoracic aortic aneurysm, without rupture: Secondary | ICD-10-CM | POA: Diagnosis not present

## 2018-09-14 DIAGNOSIS — M810 Age-related osteoporosis without current pathological fracture: Secondary | ICD-10-CM | POA: Diagnosis not present

## 2018-09-15 LAB — COMPLETE METABOLIC PANEL WITH GFR
AG RATIO: 1.3 (calc) (ref 1.0–2.5)
ALBUMIN MSPROF: 3.9 g/dL (ref 3.6–5.1)
ALKALINE PHOSPHATASE (APISO): 106 U/L (ref 33–130)
ALT: 12 U/L (ref 6–29)
AST: 17 U/L (ref 10–35)
BILIRUBIN TOTAL: 0.5 mg/dL (ref 0.2–1.2)
BUN / CREAT RATIO: 16 (calc) (ref 6–22)
BUN: 14 mg/dL (ref 7–25)
CHLORIDE: 109 mmol/L (ref 98–110)
CO2: 27 mmol/L (ref 20–32)
Calcium: 9.9 mg/dL (ref 8.6–10.4)
Creat: 0.9 mg/dL — ABNORMAL HIGH (ref 0.60–0.88)
GFR, Est African American: 69 mL/min/{1.73_m2} (ref >=60)
GFR, Est Non African American: 60 mL/min/{1.73_m2} (ref >=60)
GLOBULIN: 3 g/dL (ref 1.9–3.7)
GLUCOSE: 91 mg/dL (ref 65–99)
POTASSIUM: 4.4 mmol/L (ref 3.5–5.3)
SODIUM: 145 mmol/L (ref 135–146)
Total Protein: 6.9 g/dL (ref 6.1–8.1)

## 2018-09-15 LAB — LIPID PANEL
CHOLESTEROL: 223 mg/dL — AB (ref <200)
HDL: 49 mg/dL — ABNORMAL LOW (ref >50)
LDL Cholesterol (Calc): 155 mg/dL (calc) — ABNORMAL HIGH
Non-HDL Cholesterol (Calc): 174 mg/dL (calc) — ABNORMAL HIGH (ref <130)
Total CHOL/HDL Ratio: 4.6 (calc) (ref <5.0)
Triglycerides: 89 mg/dL (ref <150)

## 2018-09-15 LAB — VITAMIN D 25 HYDROXY (VIT D DEFICIENCY, FRACTURES): Vit D, 25-Hydroxy: 28 ng/mL — ABNORMAL LOW (ref 30–100)

## 2019-01-03 ENCOUNTER — Ambulatory Visit (INDEPENDENT_AMBULATORY_CARE_PROVIDER_SITE_OTHER): Payer: Medicare HMO | Admitting: Family Medicine

## 2019-01-03 ENCOUNTER — Encounter: Payer: Self-pay | Admitting: Family Medicine

## 2019-01-03 VITALS — BP 159/78 | HR 83 | Ht 64.0 in | Wt 138.0 lb

## 2019-01-03 DIAGNOSIS — F0280 Dementia in other diseases classified elsewhere without behavioral disturbance: Secondary | ICD-10-CM

## 2019-01-03 DIAGNOSIS — M25531 Pain in right wrist: Secondary | ICD-10-CM

## 2019-01-03 DIAGNOSIS — M79644 Pain in right finger(s): Secondary | ICD-10-CM | POA: Diagnosis not present

## 2019-01-03 DIAGNOSIS — F0390 Unspecified dementia without behavioral disturbance: Secondary | ICD-10-CM

## 2019-01-03 DIAGNOSIS — G301 Alzheimer's disease with late onset: Secondary | ICD-10-CM

## 2019-01-03 MED ORDER — DICLOFENAC SODIUM 1 % TD GEL: 2.0000 g | Freq: Four times a day (QID) | TRANSDERMAL | 3 refills | Status: DC | PRN

## 2019-01-03 MED ORDER — DONEPEZIL HCL 23 MG PO TABS: 23.0000 mg | ORAL_TABLET | Freq: Every day | ORAL | 3 refills | Status: DC

## 2019-01-03 NOTE — Assessment & Plan Note (Signed)
We can certainly increase Aricept to 23 mg.  They currently have 10 mg dose at home is okay to double up until she runs out as they recently put a 90-day supply.  If any new symptoms then please let me know.  Otherwise I would like to see her back in 3 to 4 months to make sure that she is doing well and to keep an eye on her blood pressure which is also elevated today.

## 2019-01-03 NOTE — Progress Notes (Signed)
Acute Office Visit  Subjective:    Patient ID: Evelyn Greer, female    DOB: [birth year], 85 y.o.   MRN: [id number]  Chief Complaint  Patient presents with  . Wrist Pain    R wrist pain    HPI Patient is in today for right wrist pain and discomfort that is been going on since February.  Her daughter has noticed that a lot of times if she will reach to pick up a glass with that right hand she will actually use both hands to pick it up.  She has not tried any treatments such as heat or ice or anti-inflammatories.  F/U dementia - the family discussed options of adjusting her aricept. They would like ot try the higher strength if possible.      Past Medical History:  Diagnosis Date  . Hearing loss    sensorineural  . Hyperlipidemia   . Hypertension   . Multiple thyroid nodules    nodules 2009 last U/S    Past Surgical History:  Procedure Laterality Date  . ABDOMINAL HYSTERECTOMY     TAH w/o oophrectomy for fibroids    Family History  Problem Relation Age of Onset  . Heart disease Mother 29       CHF  . Heart disease Father 51       heart attack  . Cancer Sister        premenopausal breast cancer  . Depression Brother 69       committed suicide     Social History   Socioeconomic History  . Marital status: Single    Spouse name: Not on file  . Number of children: Not on file  . Years of education: Not on file  . Highest education level: Not on file  Occupational History  . Occupation: Retired.   Social Needs  . Financial resource strain: Not on file  . Food insecurity:    Worry: Not on file    Inability: Not on file  . Transportation needs:    Medical: Not on file    Non-medical: Not on file  Tobacco Use  . Smoking status: Never Smoker  . Smokeless tobacco: Never Used  Substance and Sexual Activity  . Alcohol use: No  . Drug use: No  . Sexual activity: Not Currently  Lifestyle  . Physical activity:    Days per week: Not on file    Minutes per  session: Not on file  . Stress: Not on file  Relationships  . Social connections:    Talks on phone: Not on file    Gets together: Not on file    Attends religious service: Not on file    Active member of club or organization: Not on file    Attends meetings of clubs or organizations: Not on file    Relationship status: Not on file  . Intimate partner violence:    Fear of current or ex partner: Not on file    Emotionally abused: Not on file    Physically abused: Not on file    Forced sexual activity: Not on file  Other Topics Concern  . Not on file  Social History Narrative   Lives alone    Outpatient Medications Prior to Visit  Medication Sig Dispense Refill  . Calcium Carbonate-Vitamin D (CALCIUM-VITAMIN D) 500-200 MG-UNIT tablet Take 1 tablet by mouth 2 (two) times daily.    . MULTIPLE MINERALS-VITAMINS PO Take by mouth.    . vitamin C (  ASCORBIC ACID) 500 MG tablet Take 500 mg by mouth daily.    . vitamin E 200 UNIT capsule Take 200 Units by mouth daily.    Marland Kitchen donepezil (ARICEPT) 10 MG tablet Take 1 tablet (10 mg total) by mouth at bedtime. 90 tablet 3   No facility-administered medications prior to visit.     Allergies  Allergen Reactions  . Niacin And Related Other (See Comments)    Flush,tingling in legs  . Atorvastatin     REACTION: Pain in legs    ROS     Objective:    Physical Exam  Constitutional: She is oriented to person, place, and time. She appears well-developed and well-nourished.  HENT:  Head: Normocephalic and atraumatic.  Eyes: Conjunctivae and EOM are normal.  Cardiovascular: Normal rate.  Pulmonary/Chest: Effort normal.  Musculoskeletal:     Comments: Right wrist with NROM. Normal pincer grasp with all finger. Tender at the base of the thumb and along the ulnar medial side of the wrist. I don't see a ganglion cyst. There is some bruising.   Neurological: She is alert and oriented to person, place, and time.  Skin: Skin is dry. No pallor.   Psychiatric: She has a normal mood and affect. Her behavior is normal.  Vitals reviewed.   BP (!) 159/78   Pulse 83   Ht 5\' 4"  (1.626 m)   Wt 138 lb (62.6 kg)   SpO2 100%   BMI 23.69 kg/m  Wt Readings from Last 3 Encounters:  01/03/19 138 lb (62.6 kg)  09/12/18 136 lb (61.7 kg)  08/10/18 139 lb (63 kg)    There are no preventive care reminders to display for this patient.  There are no preventive care reminders to display for this patient.   Lab Results  Component Value Date   TSH 2.15 06/30/2017   Lab Results  Component Value Date   WBC 7.1 01/21/2018   HGB 14.3 01/21/2018   HCT 42.8 01/21/2018   MCV 88.4 01/21/2018   PLT 186 01/21/2018   Lab Results  Component Value Date   NA 145 09/14/2018   K 4.4 09/14/2018   CO2 27 09/14/2018   GLUCOSE 91 09/14/2018   BUN 14 09/14/2018   CREATININE 0.90 (H) 09/14/2018   BILITOT 0.5 09/14/2018   ALKPHOS 58 01/21/2018   AST 17 09/14/2018   ALT 12 09/14/2018   PROT 6.9 09/14/2018   ALBUMIN 4.1 01/21/2018   CALCIUM 9.9 09/14/2018   ANIONGAP 7 01/21/2018   Lab Results  Component Value Date   CHOL 223 (H) 09/14/2018   Lab Results  Component Value Date   HDL 49 (L) 09/14/2018   Lab Results  Component Value Date   LDLCALC 155 (H) 09/14/2018   Lab Results  Component Value Date   TRIG 89 09/14/2018   Lab Results  Component Value Date   CHOLHDL 4.6 09/14/2018   No results found for: HGBA1C     Assessment & Plan:   Problem List Items Addressed This Visit      Nervous and Auditory   Late onset Alzheimer's disease without behavioral disturbance ([place]) - Primary    We can certainly increase Aricept to 23 mg.  They currently have 10 mg dose at home is okay to double up until she runs out as they recently put a 90-day supply.  If any new symptoms then please let me know.  Otherwise I would like to see her back in 3 to 4 months to make  sure that she is doing well and to keep an eye on her blood pressure which is  also elevated today.      Relevant Medications   donepezil (ARICEPT) 23 MG TABS tablet    Other Visit Diagnoses    Dementia without behavioral disturbance, unspecified dementia type ([place])       Relevant Medications   donepezil (ARICEPT) 23 MG TABS tablet   Right wrist pain       Pain of right thumb         Right medial wrist and right thumb pain-probably from osteoarthritis though possibly a tendinitis as well.  She does not remember any specific injury or trauma to the area can put her in a cock-up splint for 2 weeks just for rest and to reduce inflammation.  Okay to use Tylenol as needed.  Also recommend that she ice the area as needed.  After that then I want her to make sure that she is still using her wrist and not avoiding wheezing as it is not wanted to get week.  If she notices a cyst come up again I would like to see it but it sounds like it could be a ganglion cyst I just do not see it visibly on exam today.  Pain at the base of the thumb is most likely osteoarthritis of the [facility] joint.  If she continues to have pain or problems after resting the joint for couple of weeks consider x-ray for further work-up and evaluation.  Also try topical Voltaren gel as needed for pain and inflammation.    Meds ordered this encounter  Medications  . donepezil (ARICEPT) 23 MG TABS tablet    Sig: Take 1 tablet (23 mg total) by mouth at bedtime.    Dispense:  90 tablet    Refill:  3  . diclofenac sodium (VOLTAREN) 1 % GEL    Sig: Apply 2 g topically 4 (four) times daily as needed.    Dispense:  200 g    Refill:  Chauncey, MDPt and her daughter in law report that she has been c/o R wrist pain since around the end of February. Pt denies any trauma/injury. Her daughter in law reports she has noticed that when she picks up her glass to drink she has to use both of her hands because of the weakness.   She has not used ice or heat.Beatrice Lecher, MD   Also would like to  discuss increasing the dosage of her Donepezil .Marland KitchenMarland KitchenBeatrice Lecher, MD

## 2019-01-11 ENCOUNTER — Telehealth: Payer: Self-pay

## 2019-01-11 DIAGNOSIS — F0280 Dementia in other diseases classified elsewhere without behavioral disturbance: Secondary | ICD-10-CM

## 2019-01-11 NOTE — Telephone Encounter (Signed)
Donepezil requires a PA.

## 2019-01-11 NOTE — Telephone Encounter (Signed)
Information has been sent to insurance and waiting on a response. Patient's caregiver is aware we are waiting on a response and did not have any questions.

## 2019-01-12 NOTE — Telephone Encounter (Signed)
Received fax today that additional information is required. insurance needs to know if Pt has tried and failed galantamine tablet/capsule and memantine immediate release tab.  Phone: [phone] or [phone]

## 2019-01-12 NOTE — Telephone Encounter (Signed)
Do you know if this patient has failed Galantamine or Mematine? Insurance is requesting? Please advise. Anything that will help get this approved.

## 2019-01-12 NOTE — Telephone Encounter (Signed)
She has not.  She is always been on donepezil she was just on the 10 mg dose and I am trying to increase her dose to the 23 mg tab.

## 2019-01-12 NOTE — Telephone Encounter (Signed)
The drug you asked for is non-formulary (not on [employer] list of preferred drugs). Before the drug can be covered, we need more information. Please ask your prescriber to explain to [facility] why the preferred drugs have not worked for your medical condition and/or would have bad side effects. If you have not tried the preferred drugs, including galantamine tablet/capsule, and memantine immediate-release tablet, please talk to your health care provider about prescribing one of these for you. Some preferred drugs may require an additional review and approval by [facility]. This determination was based on the [employer] Non-Formulary Exceptions Coverage Policy. **Please note that only 5mg  and 10mg  donepezil tablets are formulary (not any other strengths). Please advise. Daughter is aware we are still waiting.

## 2019-01-15 MED ORDER — DONEPEZIL HCL 10 MG PO TABS: 10.0000 mg | ORAL_TABLET | Freq: Every day | ORAL | 3 refills | Status: DC

## 2019-01-15 MED ORDER — MEMANTINE HCL 5 MG PO TABS: ORAL_TABLET | ORAL | 1 refills | Status: DC

## 2019-01-15 NOTE — Telephone Encounter (Signed)
Date of going up on the Aricept because insurance is not likely to cover it we can continue with the 10 mg Aricept and then add Namenda which is a slightly different drug.  These 2 tend to work well in combination.  I will send over new prescription to pharmacy.

## 2019-01-16 NOTE — Telephone Encounter (Signed)
I do not mind writing the letter.  I just need to clarify one thing.  Is he wanting me to write the letter addressed to her specifically or does he want me to write a letter to the [facility]?

## 2019-01-16 NOTE — Telephone Encounter (Signed)
Spoke w/pt's son and he would like the letter addressed to her and is asking that we call him once it is done.Maryruth Eve, Lahoma Crocker, CMA

## 2019-01-16 NOTE — Telephone Encounter (Signed)
Called and spoke with patients son and he is agreeable to the 2 medications daily. He is also concenrned about her driving and has taken her keys away and she still finds a way to drive her car. He is concerned with her Dementia worsening and she could get lost and not be able to find her way home. The son is requesting a  letter to states that she is no longer able to drive at this time. Would you be able to write a letter for her and he can pick it up this week if possible. Please advise.

## 2019-01-16 NOTE — Telephone Encounter (Signed)
Ok, letter is ready

## 2019-01-17 NOTE — Telephone Encounter (Signed)
Patients son advised

## 2019-03-01 ENCOUNTER — Telehealth: Payer: Self-pay | Admitting: Family Medicine

## 2019-03-01 MED ORDER — MEMANTINE HCL 10 MG PO TABS: 10.0000 mg | ORAL_TABLET | Freq: Two times a day (BID) | ORAL | 5 refills | Status: DC

## 2019-03-01 NOTE — Telephone Encounter (Signed)
I tried to call the son and was unable to get in touch with him. I then called the daughter and she states the first prescription was sent to the pharmacy. She is due for the next dose. Please advise.

## 2019-03-27 ENCOUNTER — Ambulatory Visit: Payer: Medicare HMO | Admitting: Family Medicine

## 2019-04-02 ENCOUNTER — Encounter: Payer: Self-pay | Admitting: Family Medicine

## 2019-04-02 ENCOUNTER — Other Ambulatory Visit: Payer: Self-pay

## 2019-04-02 ENCOUNTER — Ambulatory Visit (INDEPENDENT_AMBULATORY_CARE_PROVIDER_SITE_OTHER): Payer: Medicare HMO | Admitting: Family Medicine

## 2019-04-02 VITALS — BP 144/72 | HR 64 | Ht 64.0 in | Wt 146.0 lb

## 2019-04-02 DIAGNOSIS — G301 Alzheimer's disease with late onset: Secondary | ICD-10-CM | POA: Diagnosis not present

## 2019-04-02 DIAGNOSIS — F0280 Dementia in other diseases classified elsewhere without behavioral disturbance: Secondary | ICD-10-CM

## 2019-04-02 DIAGNOSIS — L989 Disorder of the skin and subcutaneous tissue, unspecified: Secondary | ICD-10-CM | POA: Diagnosis not present

## 2019-04-02 DIAGNOSIS — F419 Anxiety disorder, unspecified: Secondary | ICD-10-CM | POA: Diagnosis not present

## 2019-04-02 DIAGNOSIS — Z23 Encounter for immunization: Secondary | ICD-10-CM | POA: Diagnosis not present

## 2019-04-02 DIAGNOSIS — M1991 Primary osteoarthritis, unspecified site: Secondary | ICD-10-CM

## 2019-04-02 MED ORDER — DONEPEZIL HCL 23 MG PO TABS: 23.0000 mg | ORAL_TABLET | Freq: Every day | ORAL | 2 refills | Status: DC

## 2019-04-02 MED ORDER — MEMANTINE HCL ER 28 MG PO CP24: 28.0000 mg | ORAL_CAPSULE | Freq: Every day | ORAL | 2 refills | Status: DC

## 2019-04-02 NOTE — Assessment & Plan Note (Addendum)
Not well controlled. Will try increase the Aricept but if not helping then we may decide if we are going to continue the medication or not.  We will also see if we can switch the Namenda to the extended release so she only has to take it 1 time a day.  Convert 10 mg twice a day to the 23 mg XR.

## 2019-04-02 NOTE — Progress Notes (Signed)
Established Patient Office Visit  Subjective:  Patient ID: Evelyn Greer, female    DOB: 1933-10-14  Age: 85 y.o. MRN: [id number]  CC:  Chief Complaint  Patient presents with  . Medication Management    discuss going up on the Aricept currently on 10 mg QHS, and she is also taking Namenda 10 mg BID    HPI [name] presents for dementia follow-up.  She is currently on Aricept and Namenda.  Her daughter-in-law who is here with her today says that she is not sure if she is noticed a big difference.  Sometimes they end up taking the morning dose of Namenda late because she sleeps then.  So sometimes her is only a 6 to 7-hour difference between when the medications are being taken and she wanted to know if that was okay.  She also wanted to see if we might be able to go up on the Aricept per currently on 10 mg nightly.  Her family is also asking if there might be something that we could put her on for anxiety that does not increase her fall risk.  She also wants to know if she can take Aleve for arthritis pain.  Mostly it affects her knees.  She also has a small white-colored raised lesion on her left forearm.  Is not really bothering her but she has noticed it.  He also complains of her feet hurting and and has possible bunions.  She also brought in a DMV form to be signed today.  She has now moved in permanently with her son and daughter-in-law she is no longer living alone and just coming over to eat dinner daily.  Past Medical History:  Diagnosis Date  . Hearing loss    sensorineural  . Hyperlipidemia   . Hypertension   . Multiple thyroid nodules    nodules 2009 last U/S    Past Surgical History:  Procedure Laterality Date  . ABDOMINAL HYSTERECTOMY     TAH w/o oophrectomy for fibroids    Family History  Problem Relation Age of Onset  . Heart disease Mother 5       CHF  . Heart disease Father 26       heart attack  . Cancer Sister        premenopausal  breast cancer  . Depression Brother 44       committed suicide     Social History   Socioeconomic History  . Marital status: Single    Spouse name: Not on file  . Number of children: Not on file  . Years of education: Not on file  . Highest education level: Not on file  Occupational History  . Occupation: Retired.   Social Needs  . Financial resource strain: Not on file  . Food insecurity    Worry: Not on file    Inability: Not on file  . Transportation needs    Medical: Not on file    Non-medical: Not on file  Tobacco Use  . Smoking status: Never Smoker  . Smokeless tobacco: Never Used  Substance and Sexual Activity  . Alcohol use: No  . Drug use: No  . Sexual activity: Not Currently  Lifestyle  . Physical activity    Days per week: Not on file    Minutes per session: Not on file  . Stress: Not on file  Relationships  . Social Herbalist on phone: Not on file    Gets  together: Not on file    Attends religious service: Not on file    Active member of club or organization: Not on file    Attends meetings of clubs or organizations: Not on file    Relationship status: Not on file  . Intimate partner violence    Fear of current or ex partner: Not on file    Emotionally abused: Not on file    Physically abused: Not on file    Forced sexual activity: Not on file  Other Topics Concern  . Not on file  Social History Narrative   Lives alone    Outpatient Medications Prior to Visit  Medication Sig Dispense Refill  . Calcium Carbonate-Vitamin D (CALCIUM-VITAMIN D) 500-200 MG-UNIT tablet Take 1 tablet by mouth 2 (two) times daily.    . MULTIPLE MINERALS-VITAMINS PO Take by mouth.    . vitamin C (ASCORBIC ACID) 500 MG tablet Take 500 mg by mouth daily.    . vitamin E 200 UNIT capsule Take 200 Units by mouth daily.    Marland Kitchen donepezil (ARICEPT) 10 MG tablet Take 1 tablet (10 mg total) by mouth at bedtime. 90 tablet 3  . memantine (NAMENDA) 10 MG tablet Take 1  tablet (10 mg total) by mouth 2 (two) times daily. 60 tablet 5  . diclofenac sodium (VOLTAREN) 1 % GEL Apply 2 g topically 4 (four) times daily as needed. 200 g 3   No facility-administered medications prior to visit.     Allergies  Allergen Reactions  . Niacin And Related Other (See Comments)    Flush,tingling in legs  . Atorvastatin     REACTION: Pain in legs    ROS Review of Systems    Objective:    Physical Exam  BP (!) 144/72   Pulse 64   Ht 5\' 4"  (1.626 m)   Wt 146 lb (66.2 kg)   SpO2 99%   BMI 25.06 kg/m  Wt Readings from Last 3 Encounters:  04/02/19 146 lb (66.2 kg)  01/03/19 138 lb (62.6 kg)  09/12/18 136 lb (61.7 kg)     Health Maintenance Due  Topic Date Due  . INFLUENZA VACCINE  03/17/2019    There are no preventive care reminders to display for this patient.  Lab Results  Component Value Date   TSH 2.15 06/30/2017   Lab Results  Component Value Date   WBC 7.1 01/21/2018   HGB 14.3 01/21/2018   HCT 42.8 01/21/2018   MCV 88.4 01/21/2018   PLT 186 01/21/2018   Lab Results  Component Value Date   NA 145 09/14/2018   K 4.4 09/14/2018   CO2 27 09/14/2018   GLUCOSE 91 09/14/2018   BUN 14 09/14/2018   CREATININE 0.90 (H) 09/14/2018   BILITOT 0.5 09/14/2018   ALKPHOS 58 01/21/2018   AST 17 09/14/2018   ALT 12 09/14/2018   PROT 6.9 09/14/2018   ALBUMIN 4.1 01/21/2018   CALCIUM 9.9 09/14/2018   ANIONGAP 7 01/21/2018   Lab Results  Component Value Date   CHOL 223 (H) 09/14/2018   Lab Results  Component Value Date   HDL 49 (L) 09/14/2018   Lab Results  Component Value Date   LDLCALC 155 (H) 09/14/2018   Lab Results  Component Value Date   TRIG 89 09/14/2018   Lab Results  Component Value Date   CHOLHDL 4.6 09/14/2018   No results found for: HGBA1C    Assessment & Plan:   Problem List Items Addressed This  Visit      Nervous and Auditory   Late onset Alzheimer's disease without behavioral disturbance ([place])    Not well  controlled. Will try increase the Aricept but if not helping then we may decide if we are going to continue the medication or not.  We will also see if we can switch the Namenda to the extended release so she only has to take it 1 time a day.  Convert 10 mg twice a day to the 23 mg XR.      Relevant Medications   donepezil (ARICEPT) 23 MG TABS tablet   memantine (NAMENDA XR) 28 MG CP24 24 hr capsule     Musculoskeletal and Integument   Primary osteoarthritis    Discussed treatment.  Recommend using Tylenol arthritis on a more frequent basis but if it is just occasional then okay to use Aleve she does have normal renal function but I really do not want her using more than a couple of tabs per week.       Other Visit Diagnoses    Skin lesion    -  Primary   Anxiety          Skin lesion-the lesion on her forearm looks most consistent with either a wart or hyperkeratotic lesion such as a seborrheic keratosis or possibly a early point of cell skin cancer.  Recommend cryotherapy for treatment since right now it is fairly small.  If it returns then will recommend shave biopsy for further evaluation.  Anxiety -patient herself denied anxiety even though the family is concerned.  I do not think she would be a good candidate for benzodiazepines or antihistamines but we might consider low-dose SSRI.  I can discuss with her daughter a little bit further.  DMV form completed today.  Permanent placard requested.  She is no longer driving. Meds ordered this encounter  Medications  . donepezil (ARICEPT) 23 MG TABS tablet    Sig: Take 1 tablet (23 mg total) by mouth at bedtime.    Dispense:  30 tablet    Refill:  2  . memantine (NAMENDA XR) 28 MG CP24 24 hr capsule    Sig: Take 1 capsule (28 mg total) by mouth daily.    Dispense:  30 capsule    Refill:  2    Follow-up: Return in about 3 months (around 07/03/2019) for Medication changes.    Beatrice Lecher, MD

## 2019-04-02 NOTE — Assessment & Plan Note (Addendum)
Discussed treatment.  Recommend using Tylenol arthritis on a more frequent basis but if it is just occasional then okay to use Aleve she does have normal renal function but I really do not want her using more than a couple of tabs per week.

## 2019-04-02 NOTE — Patient Instructions (Signed)
Go to try to switch her Namenda to the once a day version so it will be easier to take.  It looks like it is covered under your insurance.  The milligram dose is different but it is equivalent to what you are currently taking.  Recommend an over-the-counter daily multivitamin.  Centrum Silver is a good option.  Also get a try increasing your Aricept dose just slightly.  We may need to see if your insurance will cover this or not.

## 2019-04-03 DIAGNOSIS — Z23 Encounter for immunization: Secondary | ICD-10-CM | POA: Diagnosis not present

## 2019-04-03 DIAGNOSIS — F419 Anxiety disorder, unspecified: Secondary | ICD-10-CM | POA: Diagnosis not present

## 2019-04-03 DIAGNOSIS — F0280 Dementia in other diseases classified elsewhere without behavioral disturbance: Secondary | ICD-10-CM | POA: Diagnosis not present

## 2019-04-03 DIAGNOSIS — L989 Disorder of the skin and subcutaneous tissue, unspecified: Secondary | ICD-10-CM | POA: Diagnosis not present

## 2019-04-03 DIAGNOSIS — G301 Alzheimer's disease with late onset: Secondary | ICD-10-CM | POA: Diagnosis not present

## 2019-04-03 DIAGNOSIS — M1991 Primary osteoarthritis, unspecified site: Secondary | ICD-10-CM | POA: Diagnosis not present

## 2019-04-04 ENCOUNTER — Encounter: Payer: Self-pay | Admitting: Family Medicine

## 2019-04-04 MED ORDER — MEMANTINE HCL 10 MG PO TABS: 10.0000 mg | ORAL_TABLET | Freq: Two times a day (BID) | ORAL | 3 refills | Status: DC

## 2019-04-05 ENCOUNTER — Ambulatory Visit: Payer: Medicare HMO | Admitting: Family Medicine

## 2019-06-09 IMAGING — DX DG CHEST 2V
2 series · 2 of 2 positions shown · non-contrast
Comparison: 09/10/2017 chest CT angiogram

CLINICAL DATA: Right upper quadrant pain

EXAM:
CHEST - 2 VIEW

[chest pa]
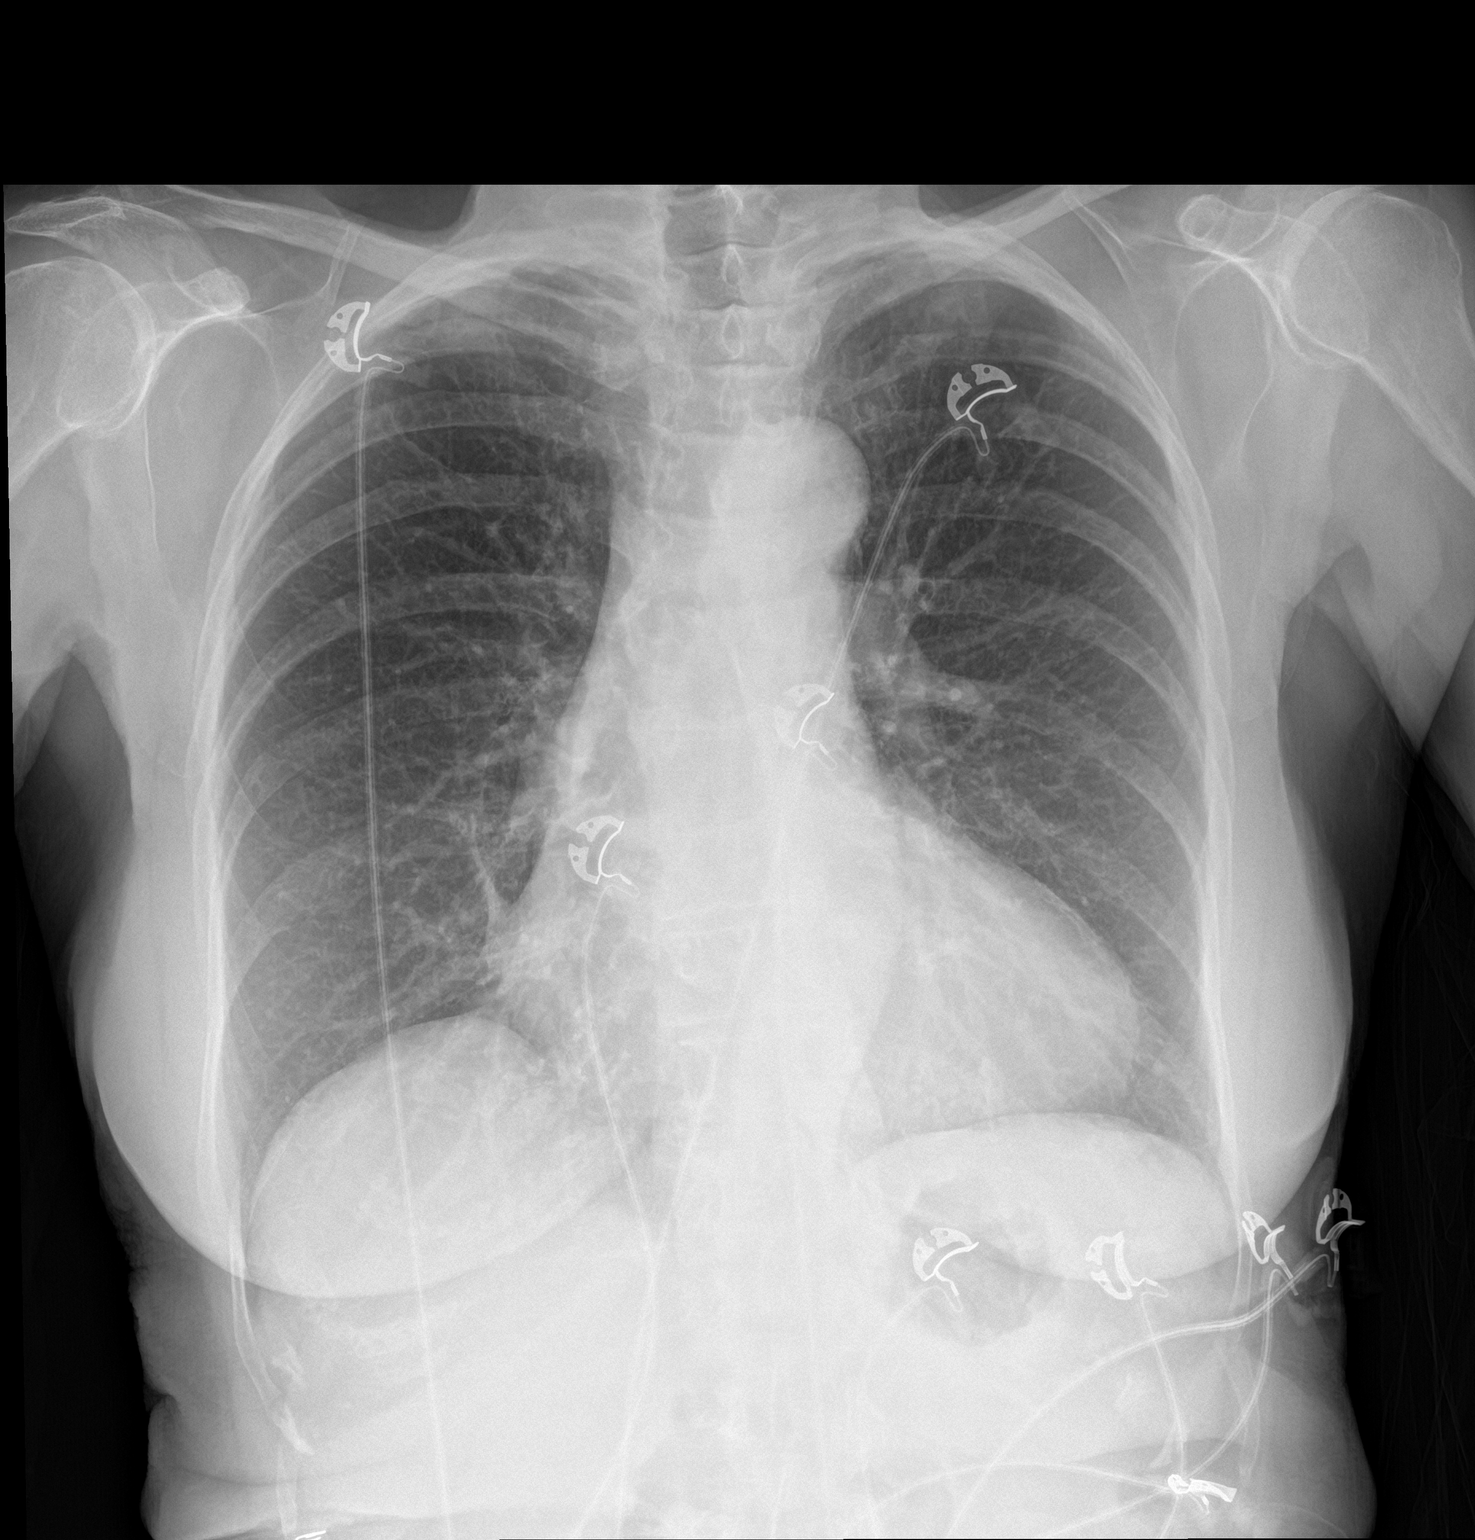

[chest lat]
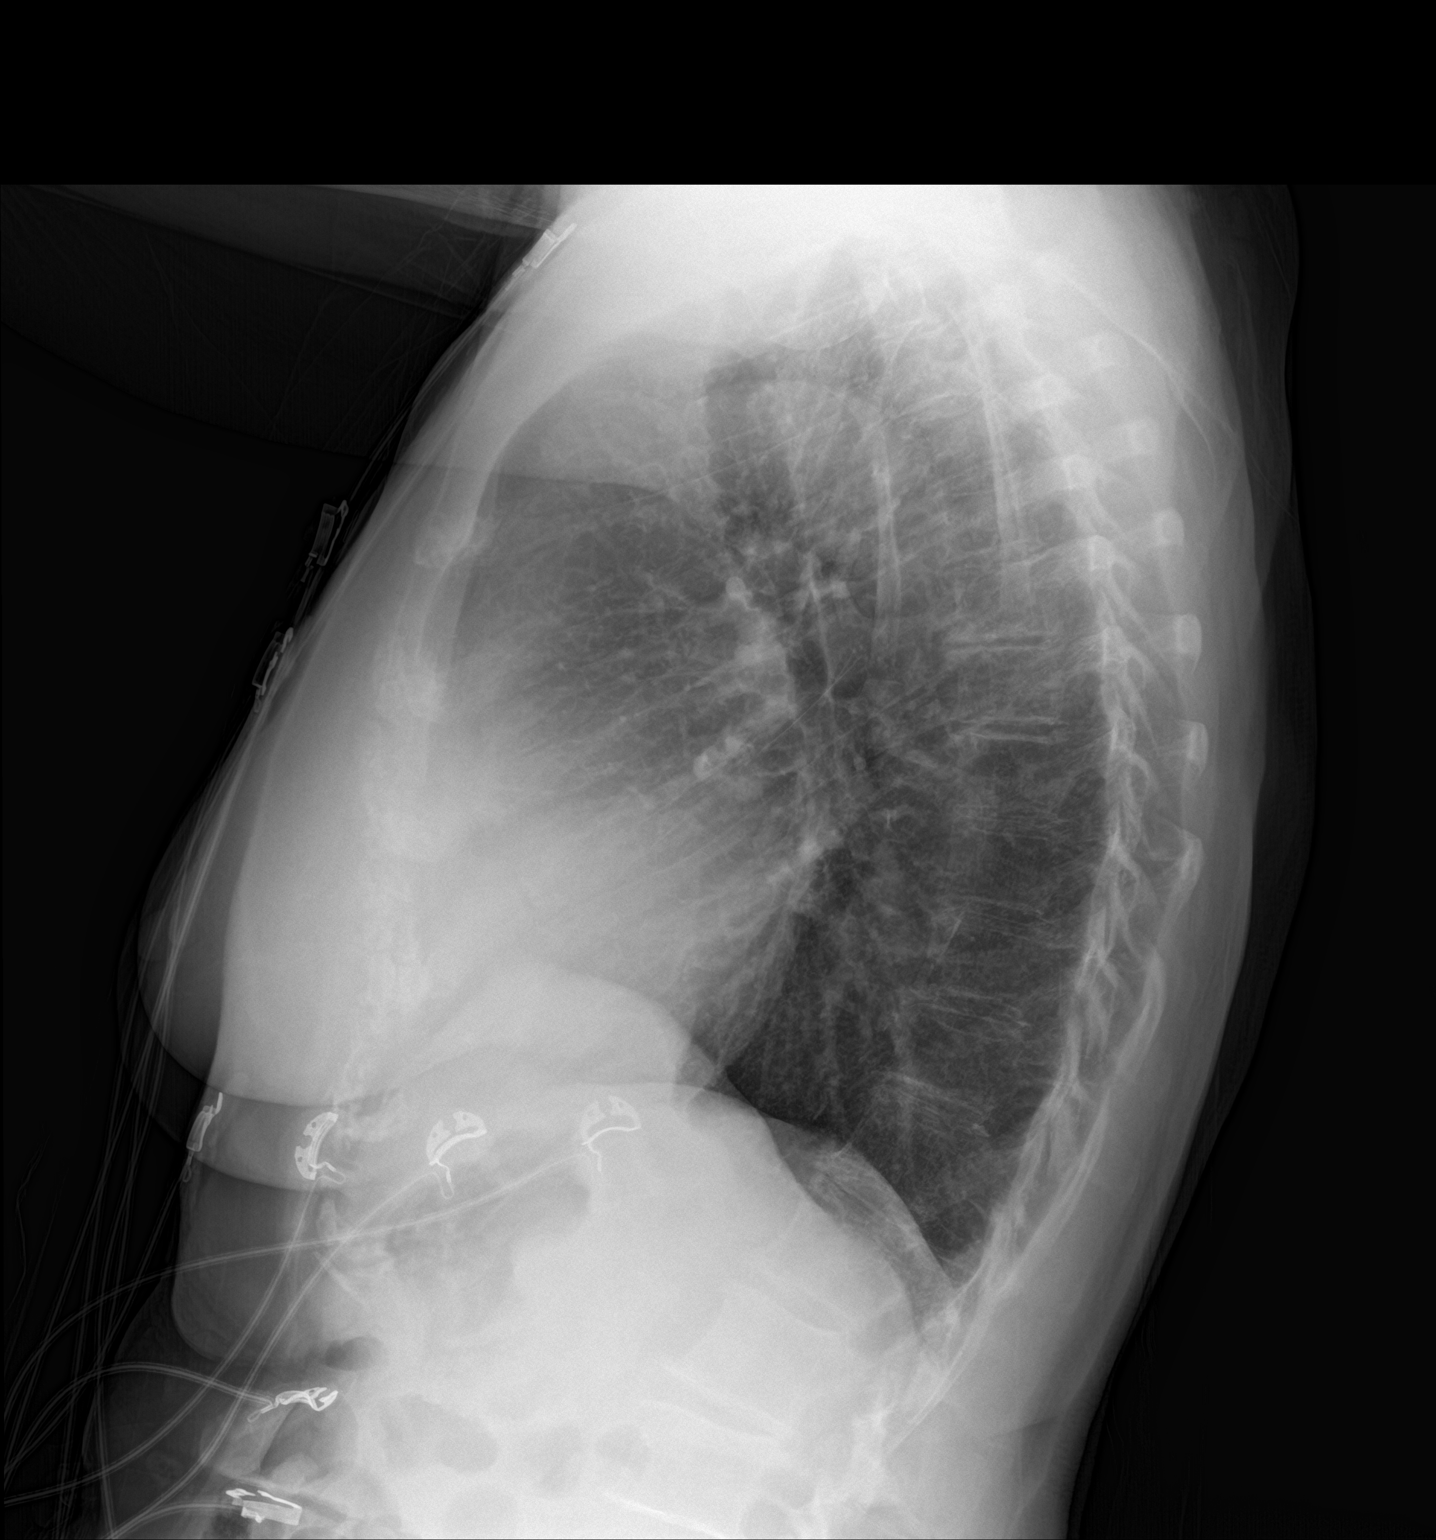

[2 of 2 positions shown; findings below may reference images not displayed]

FINDINGS: Stable cardiomediastinal silhouette with mild cardiomegaly. No
pneumothorax. No pleural effusion. Lungs appear clear, with no acute
consolidative airspace disease and no pulmonary edema. Stable mild
eventration of the anterior right hemidiaphragm.
IMPRESSION: Stable mild cardiomegaly without pulmonary edema. No active
pulmonary disease.

## 2019-06-09 IMAGING — CT CT ABD-PELV W/ CM
2 of 5 series · 15 of 46 positions shown, 17 images · IV contrast (APPLIED)
Comparison: 08/03/2016 CT abdomen/pelvis.

CLINICAL DATA: Right upper quadrant abdominal pain.

EXAM:
CT ABDOMEN AND PELVIS WITH CONTRAST
TECHNIQUE: Multidetector CT imaging of the abdomen and pelvis was performed
using the standard protocol following bolus administration of
intravenous contrast.
CONTRAST:  100mL UM4CPR-QMM IOPAMIDOL (UM4CPR-QMM) INJECTION 61%

[Series 2: axial st · axial · 0.77mm/px · z∈[-402,-36]mm · 12 of 83 slices shown, 14 images]
[im 5/83  soft-tissue]
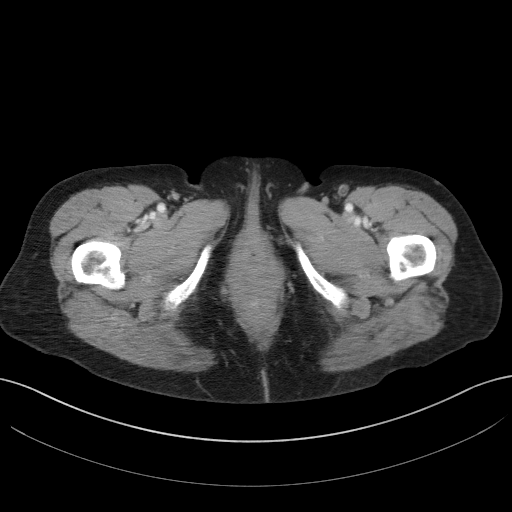
[im 5/83  bone]
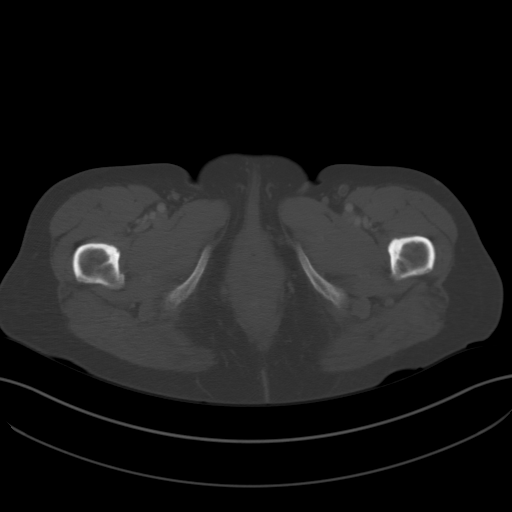
[im 14/83  soft-tissue]
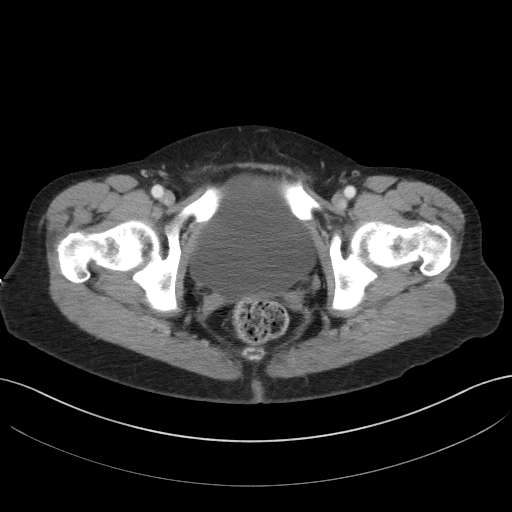
[im 19/83  soft-tissue]
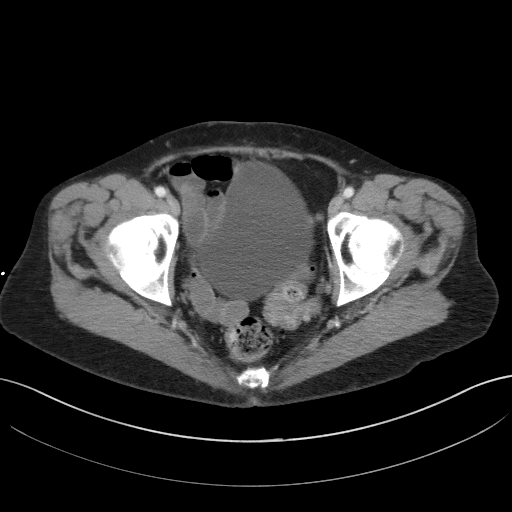
[im 23/83  soft-tissue]
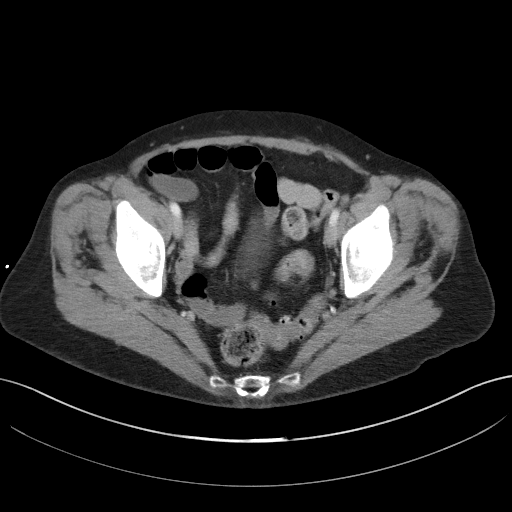
[im 32/83  soft-tissue]
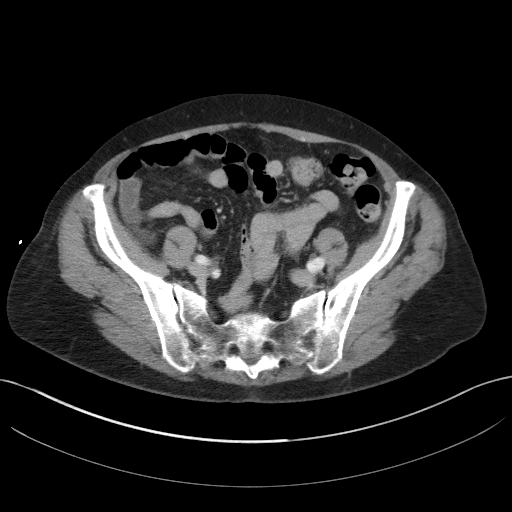
[im 37/83  soft-tissue]
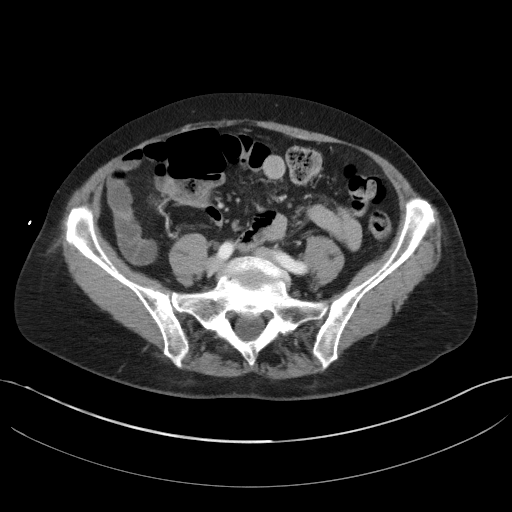
[im 46/83  soft-tissue]
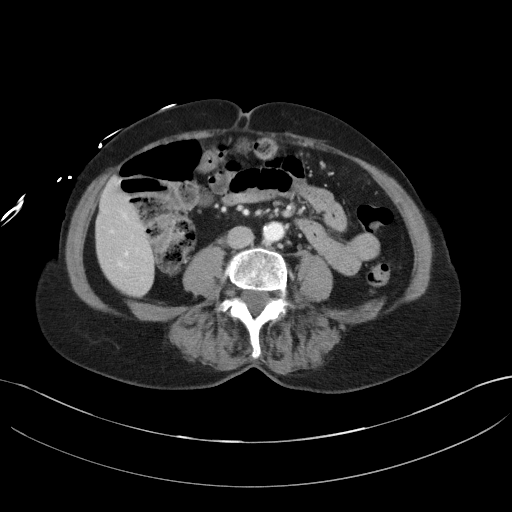
[im 51/83  soft-tissue]
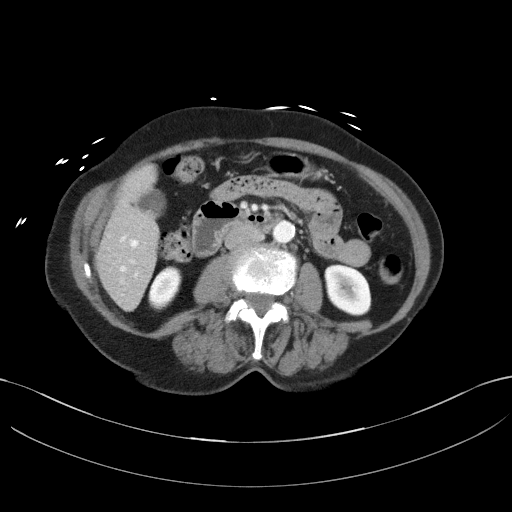
[im 60/83  soft-tissue]
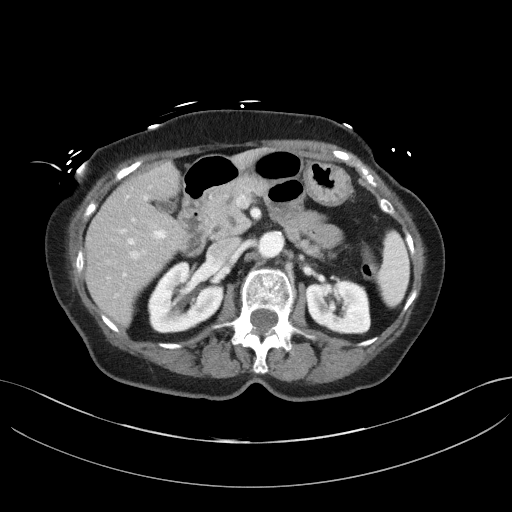
[im 60/83  bone]
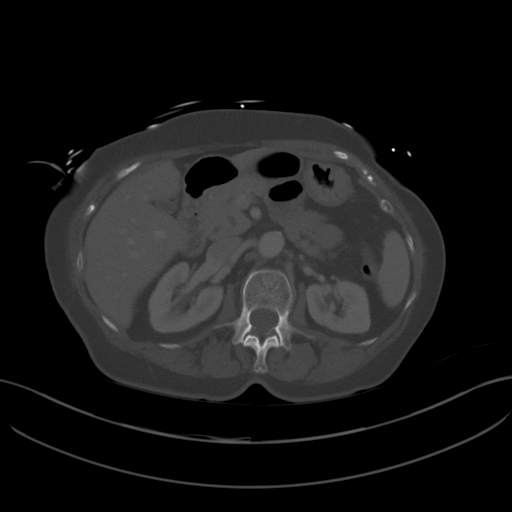
[im 64/83  soft-tissue]
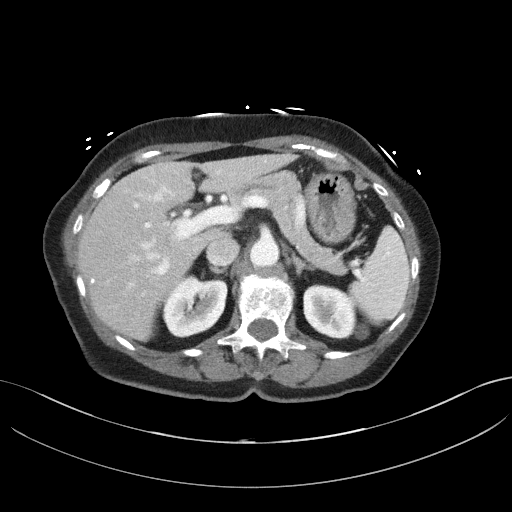
[im 69/83  soft-tissue]
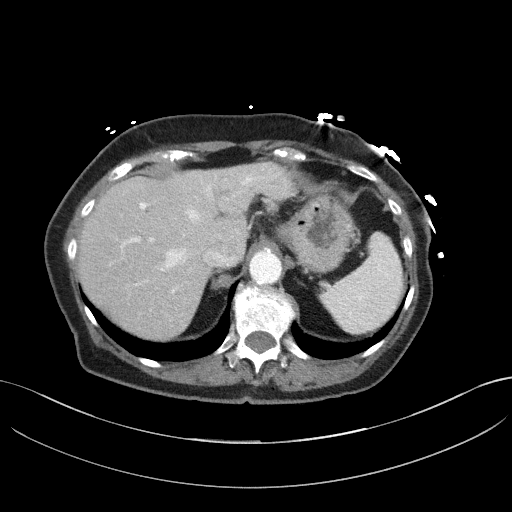
[im 78/83  soft-tissue]
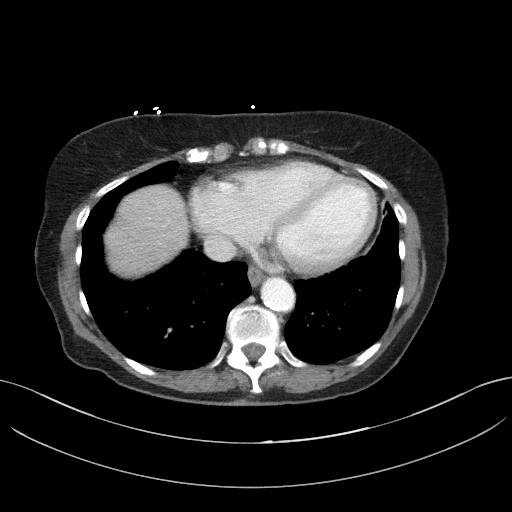

[Series 5: coronal st · coronal · 0.76mm/px · 3 of 72 slices shown]
[im 24/72  soft-tissue]
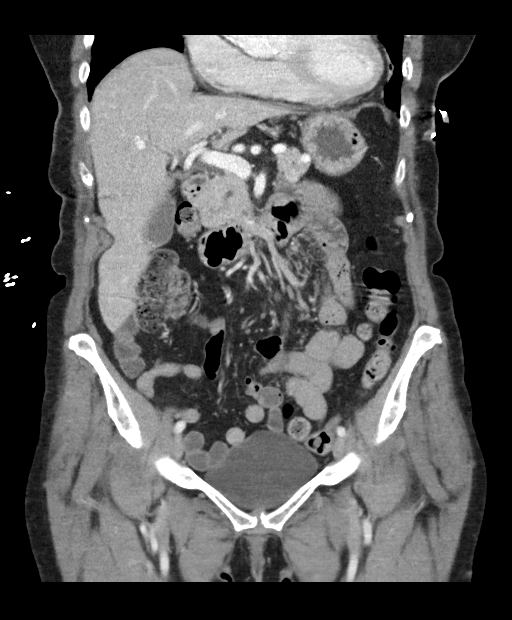
[im 32/72  soft-tissue]
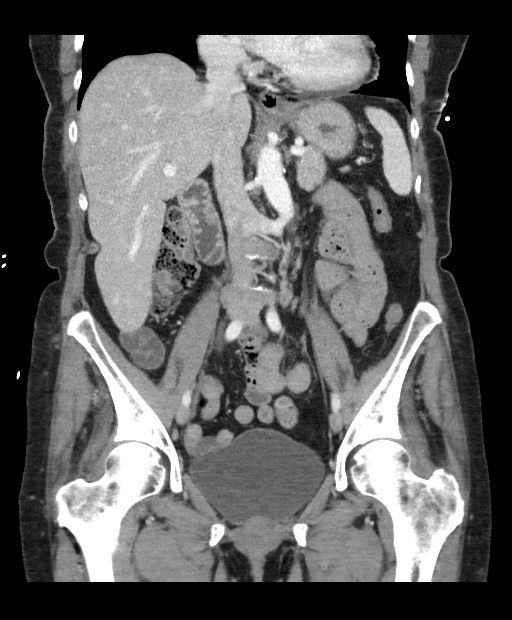
[im 40/72  soft-tissue]
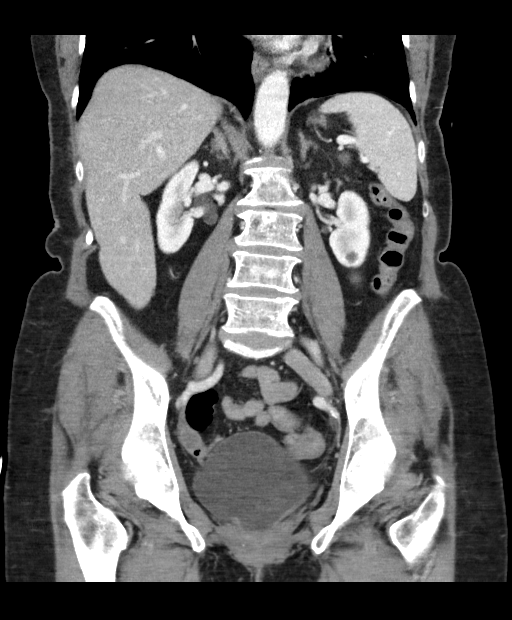

[15 of 46 positions shown; findings below may reference images not displayed]

FINDINGS: Lower chest: No significant pulmonary nodules or acute consolidative
airspace disease.

Hepatobiliary: Riedel's lobe configuration of the liver. Previously
noted dominant 5.2 cm lateral segment left liver lobe cyst on
08/03/2016 CT study has substantially decreased in size, now 0.7 cm
(series 2/image 14). Additional scattered small simple liver cysts,
largest 1.7 cm in the posterior right liver lobe. Additional
scattered subcentimeter hypodense liver lesions, too small to
characterize, not appreciably changed, considered benign. No new
liver lesions. Cholelithiasis. No gallbladder distention. No
gallbladder wall thickening. No pericholecystic fluid. No biliary
ductal dilatation. CBD diameter 5 mm.

Pancreas: No pancreatic mass. Top-normal caliber main pancreatic
duct is not appreciably changed. No peripancreatic fluid
collections.

Spleen: Normal size spleen. Subcentimeter hypodense inferior splenic
lesion, too small to characterize, not definitely seen on prior CT.
No additional splenic lesions.

Adrenals/Urinary Tract: Normal adrenals. Small simple left renal
cysts, largest an exophytic 1.3 cm lateral upper left renal simple
cyst. No hydronephrosis. Small cystocele. Moderately distended and
otherwise normal bladder.

Stomach/Bowel: Small hiatal hernia. Otherwise normal nondistended
stomach. Normal caliber small bowel with no small bowel wall
thickening. Normal appendix. Mild sigmoid diverticulosis, with no
large bowel wall thickening or significant pericolonic fat
stranding.

Vascular/Lymphatic: Atherosclerotic nonaneurysmal abdominal aorta.
Patent portal, splenic, hepatic and renal veins. Retroaortic left
renal vein. No pathologically enlarged lymph nodes in the abdomen or
pelvis.

Reproductive: Status post hysterectomy, with no abnormal findings at
the vaginal cuff. No adnexal mass.

Other: No pneumoperitoneum, ascites or focal fluid collection.

Musculoskeletal: No aggressive appearing focal osseous lesions.
Moderate thoracolumbar spondylosis.
IMPRESSION: 1. No acute abnormality. No evidence of bowel obstruction or acute
bowel inflammation. Mild sigmoid diverticulosis, no evidence of
acute diverticulitis.
2. Cholelithiasis. No CT findings of acute cholecystitis. No biliary
ductal dilatation.
3. Small hiatal hernia.
4. Small cystocele.
5.  Aortic Atherosclerosis ([id number]-S2G.G).

## 2019-06-15 ENCOUNTER — Encounter: Payer: Self-pay | Admitting: Family Medicine

## 2019-06-26 ENCOUNTER — Other Ambulatory Visit: Payer: Self-pay

## 2019-06-26 ENCOUNTER — Ambulatory Visit (INDEPENDENT_AMBULATORY_CARE_PROVIDER_SITE_OTHER): Payer: Medicare HMO | Admitting: Family Medicine

## 2019-06-26 ENCOUNTER — Encounter: Payer: Self-pay | Admitting: Family Medicine

## 2019-06-26 ENCOUNTER — Ambulatory Visit: Payer: Medicare HMO

## 2019-06-26 VITALS — BP 160/98 | HR 100 | Ht 64.0 in | Wt 150.0 lb

## 2019-06-26 DIAGNOSIS — E785 Hyperlipidemia, unspecified: Secondary | ICD-10-CM | POA: Diagnosis not present

## 2019-06-26 DIAGNOSIS — M7989 Other specified soft tissue disorders: Secondary | ICD-10-CM | POA: Diagnosis not present

## 2019-06-26 DIAGNOSIS — Z0000 Encounter for general adult medical examination without abnormal findings: Secondary | ICD-10-CM | POA: Diagnosis not present

## 2019-06-26 NOTE — Progress Notes (Signed)
Subjective:   Evelyn Greer is a 85 y.o. female who presents for Medicare Annual (Subsequent) preventive examination.  Her daughter reports that about a week ago she had an episode of dizziness.  She has not had any recurrence of symptoms since then she just sat down and rested and drank some fluid and felt better.  On blood pressure was in the 0000000 systolic at that time.  Not had any other issues with dizziness recently.  She does have a history of an ascending aortic aneurysm which was seen on echocardiogram from December 2018.  We discussed following this up at one point we decided that if she was not symptomatic that we would hold off.  She is fairly sedentary overall.  She is ambulatory by herself but does need assistance with meal prep but she is able to make sandwiches by herself.  He now lives with her son and stepdaughter. She no longer cleans the house.   She is here today with her daughter Evelyn Greer today who helped answer the questions.    Review of Systems:   comprehensive ROS is negative except for memory.   Cardiac Risk Factors include: advanced age (>31men, >38 women)     Objective:     Vitals: BP (!) 160/98   Pulse 100   Ht 5\' 4"  (1.626 m)   Wt 150 lb (68 kg)   SpO2 97%   BMI 25.75 kg/m   Body mass index is 25.75 kg/m.  Advanced Directives 05/19/2017 12/15/2015 09/09/2014  Does Patient Have a Medical Advance Directive? No Yes Yes  Type of Advance Directive - Living will Living will  Does patient want to make changes to medical advance directive? - No - Patient declined No - Patient declined  Copy of [place] in Chart? - No - copy requested No - copy requested  Would patient like information on creating a medical advance directive? No - Patient declined - -    Tobacco Social History   Tobacco Use  Smoking Status Never Smoker  Smokeless Tobacco Never Used     Counseling given: Not Answered   Clinical Intake:    Physical  Exam Constitutional:      Appearance: She is well-developed.  HENT:     Head: Normocephalic and atraumatic.  Cardiovascular:     Rate and Rhythm: Normal rate and regular rhythm.     Heart sounds: Normal heart sounds.  Pulmonary:     Effort: Pulmonary effort is normal.     Breath sounds: Normal breath sounds.  Skin:    General: Skin is warm and dry.  Neurological:     Mental Status: She is alert and oriented to person, place, and time.  Psychiatric:        Behavior: Behavior normal.     Pain : No/denies pain     Nutritional Status: BMI 25 -29 Overweight Diabetes: No     Interpreter Needed?: No     Past Medical History:  Diagnosis Date  . Hearing loss    sensorineural  . Hyperlipidemia   . Hypertension   . Multiple thyroid nodules    nodules 2009 last U/S   Past Surgical History:  Procedure Laterality Date  . ABDOMINAL HYSTERECTOMY     TAH w/o oophrectomy for fibroids   Family History  Problem Relation Age of Onset  . Heart disease Mother 50       CHF  . Heart disease Father 63  heart attack  . Cancer Sister        premenopausal breast cancer  . Depression Brother 81       committed suicide    Social History   Socioeconomic History  . Marital status: Single    Spouse name: Not on file  . Number of children: Not on file  . Years of education: Not on file  . Highest education level: Not on file  Occupational History  . Occupation: Retired.   Social Needs  . Financial resource strain: Not on file  . Food insecurity    Worry: Not on file    Inability: Not on file  . Transportation needs    Medical: Not on file    Non-medical: Not on file  Tobacco Use  . Smoking status: Never Smoker  . Smokeless tobacco: Never Used  Substance and Sexual Activity  . Alcohol use: No  . Drug use: No  . Sexual activity: Not Currently  Lifestyle  . Physical activity    Days per week: Not on file    Minutes per session: Not on file  . Stress: Not on file   Relationships  . Social Herbalist on phone: Not on file    Gets together: Not on file    Attends religious service: Not on file    Active member of club or organization: Not on file    Attends meetings of clubs or organizations: Not on file    Relationship status: Not on file  Other Topics Concern  . Not on file  Social History Narrative   Lives alone    Outpatient Encounter Medications as of 06/26/2019  Medication Sig  . donepezil (ARICEPT) 10 MG tablet Take 10 mg by mouth daily.  . Calcium Carbonate-Vitamin D (CALCIUM-VITAMIN D) 500-200 MG-UNIT tablet Take 1 tablet by mouth 2 (two) times daily.  . memantine (NAMENDA) 10 MG tablet Take 1 tablet (10 mg total) by mouth 2 (two) times daily.  . MULTIPLE MINERALS-VITAMINS PO Take by mouth.  . vitamin C (ASCORBIC ACID) 500 MG tablet Take 500 mg by mouth daily.  . vitamin E 200 UNIT capsule Take 200 Units by mouth daily.   No facility-administered encounter medications on file as of 06/26/2019.     Activities of Daily Living In your present state of health, do you have any difficulty performing the following activities: 06/26/2019  Hearing? N  Vision? N  Difficulty concentrating or making decisions? Y  Walking or climbing stairs? N  Dressing or bathing? N  Doing errands, shopping? Y  Preparing Food and eating ? N  Using the Toilet? N  In the past six months, have you accidently leaked urine? N  Do you have problems with loss of bowel control? N  Managing your Medications? Y  Managing your Finances? Y  Housekeeping or managing your Housekeeping? N  Some recent data might be hidden    Patient Care Team: Hali Marry, MD as PCP - General (Family Medicine)    Assessment:   This is a routine wellness examination for Evelyn Greer.  Exercise Activities and Dietary recommendations Current Exercise Habits: The patient does not participate in regular exercise at present  Goals   None     Fall Risk Fall Risk   06/26/2019 04/02/2019 08/10/2018 05/23/2018 05/19/2017  Falls in the past year? 1 0 1 No Yes  Number falls in past yr: 0 0 0 - 1  Injury with Fall? 0 0 1 - Yes  Risk for fall due to : Mental status change Mental status change History of fall(s);Other (Comment) - -  Follow up - Falls prevention discussed Falls prevention discussed - Falls prevention discussed     Depression Screen PHQ 2/9 Scores 04/02/2019 05/23/2018 05/19/2017 12/15/2015  PHQ - 2 Score 0 2 0 0  PHQ- 9 Score - 8 - -     Cognitive Function MMSE - Mini Mental State Exam 06/26/2019 05/23/2018 06/02/2017  Orientation to time 1 2 2   Orientation to Place 4 4 3   Registration 3 3 3   Attention/ Calculation 5 0 4  Recall 0 0 0  Language- name 2 objects 2 2 2   Language- repeat 0 0 1  Language- follow 3 step command 3 3 3   Language- read & follow direction 1 1 1   Write a sentence 1 0 1  Copy design 1 1 1   Total score 21 16 21      6CIT Screen 05/19/2017  What Year? 4 points  What month? 3 points  What time? 0 points  Count back from 20 4 points  Months in reverse 4 points  Repeat phrase 8 points  Total Score 23    Immunization History  Administered Date(s) Administered  . Fluad Quad(high Dose 65+) 04/03/2019  . Influenza, High Dose Seasonal PF 05/19/2017  . Influenza,inj,Quad PF,6+ Mos 09/09/2014  . Pneumococcal Conjugate-13 09/09/2014  . Pneumococcal Polysaccharide-23 01/05/2010  . Tdap 02/22/2011    Qualifies for Shingles Vaccine?Yes.   Screening Tests Health Maintenance  Topic Date Due  . TETANUS/TDAP  02/21/2021  . INFLUENZA VACCINE  Completed  . DEXA SCAN  Completed  . PNA vac Low Risk Adult  Completed    Cancer Screenings: Lung: Low Dose CT Chest recommended if Age 32-80 years, 30 pack-year currently smoking OR have quit w/in 15years. Patient does not qualify. Breast:  Up to date on Mammogram? NOt needed.  Up to date of Bone Density/Dexa? Yes Colorectal: No longer needed.    Additional  Screenings: Hepatitis C Screening:      Plan:   Medicare Wellness Exam    I have personally reviewed and noted the following in the patient's chart:   . Medical and social history . Use of alcohol, tobacco or illicit drugs  . Current medications and supplements . Functional ability and status . Nutritional status . Physical activity . Advanced directives . List of other physicians . Hospitalizations, surgeries, and ER visits in previous 12 months . Vitals . Screenings to include cognitive, depression, and falls . Referrals and appointments  In addition, I have reviewed and discussed with patient certain preventive protocols, quality metrics, and best practice recommendations. A written personalized care plan for preventive services as well as general preventive health recommendations were provided to patient.     Beatrice Lecher, MD  06/26/2019

## 2019-06-26 NOTE — Patient Instructions (Addendum)
Good news!  You do not have cancer!! This is wonderful.     Consider getting the shingles vaccine.   If you get dizzy again, check your pulse.

## 2019-06-29 DIAGNOSIS — E785 Hyperlipidemia, unspecified: Secondary | ICD-10-CM | POA: Diagnosis not present

## 2019-06-29 DIAGNOSIS — M7989 Other specified soft tissue disorders: Secondary | ICD-10-CM | POA: Diagnosis not present

## 2019-06-30 LAB — COMPLETE METABOLIC PANEL WITH GFR
AG Ratio: 1.7 (calc) (ref 1.0–2.5)
ALT: 18 U/L (ref 6–29)
AST: 22 U/L (ref 10–35)
Albumin: 4.2 g/dL (ref 3.6–5.1)
Alkaline phosphatase (APISO): 50 U/L (ref 37–153)
BUN/Creatinine Ratio: 12 (calc) (ref 6–22)
BUN: 12 mg/dL (ref 7–25)
CO2: 27 mmol/L (ref 20–32)
Calcium: 9.4 mg/dL (ref 8.6–10.4)
Chloride: 106 mmol/L (ref 98–110)
Creat: 0.99 mg/dL — ABNORMAL HIGH (ref 0.60–0.88)
GFR, Est African American: 61 mL/min/{1.73_m2} (ref >=60)
GFR, Est Non African American: 53 mL/min/{1.73_m2} — ABNORMAL LOW (ref >=60)
Globulin: 2.5 g/dL (calc) (ref 1.9–3.7)
Glucose, Bld: 90 mg/dL (ref 65–99)
Potassium: 4 mmol/L (ref 3.5–5.3)
Sodium: 140 mmol/L (ref 135–146)
Total Bilirubin: 0.6 mg/dL (ref 0.2–1.2)
Total Protein: 6.7 g/dL (ref 6.1–8.1)

## 2019-06-30 LAB — CBC
HCT: 43 % (ref 35.0–45.0)
Hemoglobin: 14.6 g/dL (ref 11.7–15.5)
MCH: 29.7 pg (ref 27.0–33.0)
MCHC: 34 g/dL (ref 32.0–36.0)
MCV: 87.4 fL (ref 80.0–100.0)
MPV: 11.5 fL (ref 7.5–12.5)
Platelets: 200 10*3/uL (ref 140–400)
RBC: 4.92 10*6/uL (ref 3.80–5.10)
RDW: 13 % (ref 11.0–15.0)
WBC: 7.1 10*3/uL (ref 3.8–10.8)

## 2019-06-30 LAB — LIPID PANEL
Cholesterol: 249 mg/dL — ABNORMAL HIGH (ref <200)
HDL: 63 mg/dL (ref >=50)
LDL Cholesterol (Calc): 166 mg/dL (calc) — ABNORMAL HIGH
Non-HDL Cholesterol (Calc): 186 mg/dL (calc) — ABNORMAL HIGH (ref <130)
Total CHOL/HDL Ratio: 4 (calc) (ref <5.0)
Triglycerides: 96 mg/dL (ref <150)

## 2019-06-30 LAB — MICROALBUMIN / CREATININE URINE RATIO
Creatinine, Urine: 126 mg/dL (ref 20–275)
Microalb Creat Ratio: 6 mcg/mg creat (ref <30)
Microalb, Ur: 0.8 mg/dL

## 2019-06-30 LAB — TSH: TSH: 3.45 mIU/L (ref 0.40–4.50)

## 2019-07-03 ENCOUNTER — Ambulatory Visit: Payer: Medicare HMO | Admitting: Family Medicine

## 2019-07-04 ENCOUNTER — Encounter: Payer: Self-pay | Admitting: Family Medicine

## 2019-07-04 DIAGNOSIS — N1831 Chronic kidney disease, stage 3a: Secondary | ICD-10-CM | POA: Insufficient documentation

## 2019-10-10 ENCOUNTER — Encounter: Payer: Self-pay | Admitting: Family Medicine

## 2019-10-18 ENCOUNTER — Ambulatory Visit (INDEPENDENT_AMBULATORY_CARE_PROVIDER_SITE_OTHER): Payer: Medicare HMO | Admitting: Family Medicine

## 2019-10-18 ENCOUNTER — Encounter: Payer: Self-pay | Admitting: Family Medicine

## 2019-10-18 ENCOUNTER — Other Ambulatory Visit: Payer: Self-pay

## 2019-10-18 VITALS — BP 138/72 | HR 72 | Ht 64.0 in | Wt 154.0 lb

## 2019-10-18 DIAGNOSIS — L918 Other hypertrophic disorders of the skin: Secondary | ICD-10-CM | POA: Diagnosis not present

## 2019-10-18 DIAGNOSIS — L821 Other seborrheic keratosis: Secondary | ICD-10-CM

## 2019-10-18 DIAGNOSIS — L989 Disorder of the skin and subcutaneous tissue, unspecified: Secondary | ICD-10-CM | POA: Diagnosis not present

## 2019-10-18 NOTE — Progress Notes (Signed)
Acute Office Visit  Subjective:    Patient ID: Evelyn Greer, female    DOB: 31-Oct-1933, 86 y.o.   MRN: [id number]  Chief Complaint  Patient presents with  . Skin Tag    R upper eyelid     HPI Patient is in today for skin lesions on her right upper eyelid.  Her daughter-in-law is here with her today and says initially they just noticed a lesion right near the lid line but as they were trying to take pictures of it they realized there was another lesion feels little bit larger in the middle of the eyelid and then one just below the brow area.  Its not bothersome or painful irritated itchy.  Her son also noticed a mole behind her right ear that they wanted me to look at as well.  Again not bothersome painful or draining.  Past Medical History:  Diagnosis Date  . Hearing loss    sensorineural  . Hyperlipidemia   . Hypertension   . Multiple thyroid nodules    nodules 2009 last U/S    Past Surgical History:  Procedure Laterality Date  . ABDOMINAL HYSTERECTOMY     TAH w/o oophrectomy for fibroids    Family History  Problem Relation Age of Onset  . Heart disease Mother 24       CHF  . Heart disease Father 6       heart attack  . Cancer Sister        premenopausal breast cancer  . Depression Brother 67       committed suicide     Social History   Socioeconomic History  . Marital status: Single    Spouse name: Not on file  . Number of children: Not on file  . Years of education: Not on file  . Highest education level: Not on file  Occupational History  . Occupation: Retired.   Tobacco Use  . Smoking status: Never Smoker  . Smokeless tobacco: Never Used  Substance and Sexual Activity  . Alcohol use: No  . Drug use: No  . Sexual activity: Not Currently  Other Topics Concern  . Not on file  Social History Narrative   Lives alone   Social Determinants of Health   Financial Resource Strain:   . Difficulty of Paying Living Expenses: Not on file  Food  Insecurity:   . Worried About Charity fundraiser in the Last Year: Not on file  . Ran Out of Food in the Last Year: Not on file  Transportation Needs:   . Lack of Transportation (Medical): Not on file  . Lack of Transportation (Non-Medical): Not on file  Physical Activity:   . Days of Exercise per Week: Not on file  . Minutes of Exercise per Session: Not on file  Stress:   . Feeling of Stress : Not on file  Social Connections:   . Frequency of Communication with Friends and Family: Not on file  . Frequency of Social Gatherings with Friends and Family: Not on file  . Attends Religious Services: Not on file  . Active Member of Clubs or Organizations: Not on file  . Attends Archivist Meetings: Not on file  . Marital Status: Not on file  Intimate Partner Violence:   . Fear of Current or Ex-Partner: Not on file  . Emotionally Abused: Not on file  . Physically Abused: Not on file  . Sexually Abused: Not on file    Outpatient Medications  Prior to Visit  Medication Sig Dispense Refill  . donepezil (ARICEPT) 10 MG tablet Take 10 mg by mouth daily.    . Calcium Carbonate-Vitamin D (CALCIUM-VITAMIN D) 500-200 MG-UNIT tablet Take 1 tablet by mouth 2 (two) times daily.    . memantine (NAMENDA) 10 MG tablet Take 1 tablet (10 mg total) by mouth 2 (two) times daily. 180 tablet 3  . MULTIPLE MINERALS-VITAMINS PO Take by mouth.    . vitamin C (ASCORBIC ACID) 500 MG tablet Take 500 mg by mouth daily.    . vitamin E 200 UNIT capsule Take 200 Units by mouth daily.     No facility-administered medications prior to visit.    Allergies  Allergen Reactions  . Niacin And Related Other (See Comments)    Flush,tingling in legs  . Atorvastatin     REACTION: Pain in legs    Review of Systems     Objective:    Physical Exam Vitals reviewed.  Constitutional:      Appearance: She is well-developed.  HENT:     Head: Normocephalic and atraumatic.  Eyes:     Conjunctiva/sclera:  Conjunctivae normal.  Cardiovascular:     Rate and Rhythm: Normal rate.  Pulmonary:     Effort: Pulmonary effort is normal.  Skin:    General: Skin is dry.     Coloration: Skin is not pale.     Comments: On the right upper eyelid she actually has 3 separate lesions.  One along the lash line is more consistent with a skin tag.  The one across the eyelid crease is very oblong and a little over a centimeter.  And then she has a smaller more round rough textured lesion just below the brow ridge.  When she is opens her eye all 3 lesions touch each other.  Behind her right ear she has a seborrheic keratosis as well as multiple on her chest and arms.  Neurological:     Mental Status: She is alert and oriented to person, place, and time.  Psychiatric:        Behavior: Behavior normal.     BP 138/72   Pulse 72   Ht 5\' 4"  (1.626 m)   Wt 154 lb (69.9 kg)   SpO2 97%   BMI 26.43 kg/m  Wt Readings from Last 3 Encounters:  10/18/19 154 lb (69.9 kg)  06/26/19 150 lb (68 kg)  04/02/19 146 lb (66.2 kg)    There are no preventive care reminders to display for this patient.  There are no preventive care reminders to display for this patient.   Lab Results  Component Value Date   TSH 3.45 06/29/2019   Lab Results  Component Value Date   WBC 7.1 06/29/2019   HGB 14.6 06/29/2019   HCT 43.0 06/29/2019   MCV 87.4 06/29/2019   PLT 200 06/29/2019   Lab Results  Component Value Date   NA 140 06/29/2019   K 4.0 06/29/2019   CO2 27 06/29/2019   GLUCOSE 90 06/29/2019   BUN 12 06/29/2019   CREATININE 0.99 (H) 06/29/2019   BILITOT 0.6 06/29/2019   ALKPHOS 58 01/21/2018   AST 22 06/29/2019   ALT 18 06/29/2019   PROT 6.7 06/29/2019   ALBUMIN 4.1 01/21/2018   CALCIUM 9.4 06/29/2019   ANIONGAP 7 01/21/2018   Lab Results  Component Value Date   CHOL 249 (H) 06/29/2019   Lab Results  Component Value Date   HDL 63 06/29/2019   Lab  Results  Component Value Date   LDLCALC 166 (H)  06/29/2019   Lab Results  Component Value Date   TRIG 96 06/29/2019   Lab Results  Component Value Date   CHOLHDL 4.0 06/29/2019   No results found for: HGBA1C     Assessment & Plan:   Problem List Items Addressed This Visit    None    Visit Diagnoses    Skin tag    -  Primary   Relevant Orders   Ambulatory referral to Dermatology   Skin lesion       Relevant Orders   Ambulatory referral to Dermatology   Seborrheic keratosis         Skin tag/skin lesion on the right upper eyelid-most consistent with wart.  Because of location I would prefer that she have more definitive treatment with a specialist such as dermatology or oculoplastics.  I do not feel comfortable removing the mid lesion.  Seborrheic keratosis-discussed benign nature and potential treatment with cryotherapy.  Patient and daughter-in-law opted to not treat today.  No orders of the defined types were placed in this encounter.    Beatrice Lecher, MD

## 2019-10-18 NOTE — Progress Notes (Signed)
Pt has ?mole/skin tag on her R upper eyelid. They have gotten larger. She also has a mole behind her R ear that her son noticed and would like this to be checked.

## 2019-10-18 NOTE — Patient Instructions (Signed)
Patient behind her right ear is a seborrheic keratosis.  These are benign lesions and are usually inherited.  They can be treated with freezing/cryotherapy.  If at any point it becomes bothersome or irritated we can easily treat it here in the office.

## 2019-10-23 DIAGNOSIS — L820 Inflamed seborrheic keratosis: Secondary | ICD-10-CM | POA: Diagnosis not present

## 2019-10-23 DIAGNOSIS — D485 Neoplasm of uncertain behavior of skin: Secondary | ICD-10-CM | POA: Diagnosis not present

## 2019-10-23 DIAGNOSIS — L821 Other seborrheic keratosis: Secondary | ICD-10-CM | POA: Diagnosis not present

## 2019-11-20 ENCOUNTER — Ambulatory Visit (INDEPENDENT_AMBULATORY_CARE_PROVIDER_SITE_OTHER): Payer: Medicare HMO | Admitting: Family Medicine

## 2019-11-20 ENCOUNTER — Encounter: Payer: Self-pay | Admitting: Family Medicine

## 2019-11-20 ENCOUNTER — Other Ambulatory Visit: Payer: Self-pay

## 2019-11-20 VITALS — BP 148/74 | HR 82 | Ht 64.0 in | Wt 153.0 lb

## 2019-11-20 DIAGNOSIS — M25474 Effusion, right foot: Secondary | ICD-10-CM

## 2019-11-20 DIAGNOSIS — M25471 Effusion, right ankle: Secondary | ICD-10-CM

## 2019-11-20 DIAGNOSIS — F03B0 Unspecified dementia, moderate, without behavioral disturbance, psychotic disturbance, mood disturbance, and anxiety: Secondary | ICD-10-CM

## 2019-11-20 DIAGNOSIS — R410 Disorientation, unspecified: Secondary | ICD-10-CM | POA: Diagnosis not present

## 2019-11-20 DIAGNOSIS — M25472 Effusion, left ankle: Secondary | ICD-10-CM

## 2019-11-20 DIAGNOSIS — R7309 Other abnormal glucose: Secondary | ICD-10-CM | POA: Diagnosis not present

## 2019-11-20 DIAGNOSIS — F0390 Unspecified dementia without behavioral disturbance: Secondary | ICD-10-CM | POA: Diagnosis not present

## 2019-11-20 DIAGNOSIS — M25475 Effusion, left foot: Secondary | ICD-10-CM | POA: Diagnosis not present

## 2019-11-20 NOTE — Progress Notes (Signed)
Acute Office Visit  Subjective:    Patient ID: Evelyn Greer, female    DOB: 25-Jun-1934, 86 y.o.   MRN: [id number]  Chief Complaint  Patient presents with  . mood    HPI Patient is in today for change in mental status.  Her son who was not here wrote me a letter which her daughter-in-law who brought her gave to me.  They have noticed some significant changes in her behavior since starting about March 4 but particularly in the last 2 weeks.  She has been much more aggressive and "hostile".  It is mostly directed at the daughter-in-law who is here with her today because she is at home most of the time with her.  She has become much more paranoid and suspicious that people were stealing things from her.  She just gets confused and does not understand why certain things are not in the same place anymore she has been making accusations and even cussing which is not like her at all.  It is happening daily.  Her son who normally can calm her down has not been able to do so she just continues to get aggressive.  She says they have already made some changes around the house in case she becomes physically aggressive.  She says at times she refused to eat.  She is quit eating the same evening meals with them and will often times does have a peanut butter and jelly sandwich and chips for dinner.  She usually wait that for lunch as well.  She is also had times where she just did not want to eat.  She reports that her bowels are moving normally and she does take fiber supplement.  She denies any major sleep issues.  Her daughter-in-law reports that she does snore daily.  She denies any recent increase in stressors or anxiety in the home.  No over the daughter-in-law feels like she is actually been much more anxious.  She denies any headaches.  She has had an increase in ankle swelling recently but no pain in the ankles.  No recent speech changes she has had some gradual decline in her hearing but nothing  abrupt.  Her daughter has noticed that she is had a little bit more difficulty with word finding particularly in the evenings.  Denies abdominal pain.  Past Medical History:  Diagnosis Date  . Hearing loss    sensorineural  . Hyperlipidemia   . Hypertension   . Multiple thyroid nodules    nodules 2009 last U/S    Past Surgical History:  Procedure Laterality Date  . ABDOMINAL HYSTERECTOMY     TAH w/o oophrectomy for fibroids    Family History  Problem Relation Age of Onset  . Heart disease Mother 35       CHF  . Heart disease Father 49       heart attack  . Cancer Sister        premenopausal breast cancer  . Depression Brother 84       committed suicide     Social History   Socioeconomic History  . Marital status: Single    Spouse name: Not on file  . Number of children: Not on file  . Years of education: Not on file  . Highest education level: Not on file  Occupational History  . Occupation: Retired.   Tobacco Use  . Smoking status: Never Smoker  . Smokeless tobacco: Never Used  Substance and Sexual Activity  .  Alcohol use: No  . Drug use: No  . Sexual activity: Not Currently  Other Topics Concern  . Not on file  Social History Narrative   Lives alone   Social Determinants of Health   Financial Resource Strain:   . Difficulty of Paying Living Expenses:   Food Insecurity:   . Worried About Charity fundraiser in the Last Year:   . Arboriculturist in the Last Year:   Transportation Needs:   . Film/video editor (Medical):   Marland Kitchen Lack of Transportation (Non-Medical):   Physical Activity:   . Days of Exercise per Week:   . Minutes of Exercise per Session:   Stress:   . Feeling of Stress :   Social Connections:   . Frequency of Communication with Friends and Family:   . Frequency of Social Gatherings with Friends and Family:   . Attends Religious Services:   . Active Member of Clubs or Organizations:   . Attends Archivist Meetings:   Marland Kitchen  Marital Status:   Intimate Partner Violence:   . Fear of Current or Ex-Partner:   . Emotionally Abused:   Marland Kitchen Physically Abused:   . Sexually Abused:     Outpatient Medications Prior to Visit  Medication Sig Dispense Refill  . Calcium Carbonate-Vitamin D (CALCIUM-VITAMIN D) 500-200 MG-UNIT tablet Take 1 tablet by mouth 2 (two) times daily.    Marland Kitchen donepezil (ARICEPT) 10 MG tablet Take 10 mg by mouth daily.    . memantine (NAMENDA) 10 MG tablet Take 1 tablet (10 mg total) by mouth 2 (two) times daily. 180 tablet 3  . MULTIPLE MINERALS-VITAMINS PO Take by mouth.    . vitamin C (ASCORBIC ACID) 500 MG tablet Take 500 mg by mouth daily.    . vitamin E 200 UNIT capsule Take 200 Units by mouth daily.     No facility-administered medications prior to visit.    Allergies  Allergen Reactions  . Niacin And Related Other (See Comments)    Flush,tingling in legs  . Atorvastatin     REACTION: Pain in legs    Review of Systems     Objective:    Physical Exam Constitutional:      Appearance: Normal appearance. She is well-developed.  HENT:     Head: Normocephalic and atraumatic.     Right Ear: Tympanic membrane, ear canal and external ear normal.     Left Ear: Tympanic membrane, ear canal and external ear normal.     Nose: Nose normal.  Eyes:     Conjunctiva/sclera: Conjunctivae normal.     Pupils: Pupils are equal, round, and reactive to light.  Neck:     Thyroid: No thyromegaly.  Cardiovascular:     Rate and Rhythm: Normal rate and regular rhythm.     Heart sounds: Normal heart sounds.  Pulmonary:     Effort: Pulmonary effort is normal.     Breath sounds: Normal breath sounds. No wheezing.  Abdominal:     General: Abdomen is flat. Bowel sounds are normal.     Palpations: Abdomen is soft.  Musculoskeletal:     Cervical back: Neck supple.  Lymphadenopathy:     Cervical: No cervical adenopathy.  Skin:    General: Skin is warm and dry.  Neurological:     Mental Status: She is  alert and oriented to person, place, and time.  Psychiatric:        Mood and Affect: Mood normal.  BP (!) 148/74   Pulse 82   Ht 5\' 4"  (1.626 m)   Wt 153 lb (69.4 kg)   SpO2 98%   BMI 26.26 kg/m  Wt Readings from Last 3 Encounters:  11/20/19 153 lb (69.4 kg)  10/18/19 154 lb (69.9 kg)  06/26/19 150 lb (68 kg)    There are no preventive care reminders to display for this patient.  There are no preventive care reminders to display for this patient.   Lab Results  Component Value Date   TSH 3.45 06/29/2019   Lab Results  Component Value Date   WBC 7.1 06/29/2019   HGB 14.6 06/29/2019   HCT 43.0 06/29/2019   MCV 87.4 06/29/2019   PLT 200 06/29/2019   Lab Results  Component Value Date   NA 140 06/29/2019   K 4.0 06/29/2019   CO2 27 06/29/2019   GLUCOSE 90 06/29/2019   BUN 12 06/29/2019   CREATININE 0.99 (H) 06/29/2019   BILITOT 0.6 06/29/2019   ALKPHOS 58 01/21/2018   AST 22 06/29/2019   ALT 18 06/29/2019   PROT 6.7 06/29/2019   ALBUMIN 4.1 01/21/2018   CALCIUM 9.4 06/29/2019   ANIONGAP 7 01/21/2018   Lab Results  Component Value Date   CHOL 249 (H) 06/29/2019   Lab Results  Component Value Date   HDL 63 06/29/2019   Lab Results  Component Value Date   LDLCALC 166 (H) 06/29/2019   Lab Results  Component Value Date   TRIG 96 06/29/2019   Lab Results  Component Value Date   CHOLHDL 4.0 06/29/2019   No results found for: HGBA1C     Assessment & Plan:   Problem List Items Addressed This Visit      Nervous and Auditory   Moderate dementia without behavioral disturbance ([place])    Other Visit Diagnoses    Delirium    -  Primary   Relevant Orders   CBC with Differential/Platelet   B Nat Peptide   COMPLETE METABOLIC PANEL WITH GFR   TSH   Hemoglobin A1c   Urinalysis, Routine w reflex microscopic   Urine Culture   Bilateral swelling of feet and ankles       Relevant Orders   CBC with Differential/Platelet   B Nat Peptide   COMPLETE  METABOLIC PANEL WITH GFR   TSH   Hemoglobin A1c   Urinalysis, Routine w reflex microscopic   Urine Culture     Acute mental status change over the last 2 to 4 weeks with a baseline of dementia.  Discussed that we do need to look for alternative causes including infection, thyroid disorder, electrolyte disturbance etc.  It sounds like her bowels are moving normally so I do not think constipation is an issue.  She has not noticed any gland swelling no fevers or chills.  Also concerning for possible stroke.  If blood work comes back normal we will move forward with brain MRI.  No orders of the defined types were placed in this encounter.  Time spent 42 minutes in encounter.   Beatrice Lecher, MD

## 2019-11-21 LAB — CBC WITH DIFFERENTIAL/PLATELET
Absolute Monocytes: 436 cells/uL (ref 200–950)
Basophils Absolute: 33 cells/uL (ref 0–200)
Basophils Relative: 0.5 %
Eosinophils Absolute: 59 cells/uL (ref 15–500)
Eosinophils Relative: 0.9 %
HCT: 44.7 % (ref 35.0–45.0)
Hemoglobin: 14.8 g/dL (ref 11.7–15.5)
Lymphs Abs: 1551 cells/uL (ref 850–3900)
MCH: 29.2 pg (ref 27.0–33.0)
MCHC: 33.1 g/dL (ref 32.0–36.0)
MCV: 88.2 fL (ref 80.0–100.0)
MPV: 11.8 fL (ref 7.5–12.5)
Monocytes Relative: 6.6 %
Neutro Abs: 4521 cells/uL (ref 1500–7800)
Neutrophils Relative %: 68.5 %
Platelets: 220 10*3/uL (ref 140–400)
RBC: 5.07 10*6/uL (ref 3.80–5.10)
RDW: 12.4 % (ref 11.0–15.0)
Total Lymphocyte: 23.5 %
WBC: 6.6 10*3/uL (ref 3.8–10.8)

## 2019-11-21 LAB — URINALYSIS, ROUTINE W REFLEX MICROSCOPIC
Bacteria, UA: NONE SEEN /HPF
Bilirubin Urine: NEGATIVE
Glucose, UA: NEGATIVE
Hgb urine dipstick: NEGATIVE
Hyaline Cast: NONE SEEN /LPF
Ketones, ur: NEGATIVE
Nitrite: NEGATIVE
Protein, ur: NEGATIVE
RBC / HPF: NONE SEEN /HPF (ref 0–2)
Specific Gravity, Urine: 1.008 (ref 1.001–1.03)
Squamous Epithelial / HPF: NONE SEEN /HPF (ref <=5)
pH: 5.5 (ref 5.0–8.0)

## 2019-11-21 LAB — COMPLETE METABOLIC PANEL WITH GFR
AG Ratio: 1.6 (calc) (ref 1.0–2.5)
ALT: 20 U/L (ref 6–29)
AST: 25 U/L (ref 10–35)
Albumin: 4.4 g/dL (ref 3.6–5.1)
Alkaline phosphatase (APISO): 59 U/L (ref 37–153)
BUN/Creatinine Ratio: 11 (calc) (ref 6–22)
BUN: 10 mg/dL (ref 7–25)
CO2: 25 mmol/L (ref 20–32)
Calcium: 9.1 mg/dL (ref 8.6–10.4)
Chloride: 106 mmol/L (ref 98–110)
Creat: 0.95 mg/dL — ABNORMAL HIGH (ref 0.60–0.88)
GFR, Est African American: 64 mL/min/{1.73_m2} (ref >=60)
GFR, Est Non African American: 55 mL/min/{1.73_m2} — ABNORMAL LOW (ref >=60)
Globulin: 2.8 g/dL (calc) (ref 1.9–3.7)
Glucose, Bld: 97 mg/dL (ref 65–99)
Potassium: 4 mmol/L (ref 3.5–5.3)
Sodium: 140 mmol/L (ref 135–146)
Total Bilirubin: 0.4 mg/dL (ref 0.2–1.2)
Total Protein: 7.2 g/dL (ref 6.1–8.1)

## 2019-11-21 LAB — TSH: TSH: 2.79 mIU/L (ref 0.40–4.50)

## 2019-11-21 LAB — HEMOGLOBIN A1C
Hgb A1c MFr Bld: 5 % of total Hgb (ref <5.7)
Mean Plasma Glucose: 97 (calc)
eAG (mmol/L): 5.4 (calc)

## 2019-11-21 LAB — BRAIN NATRIURETIC PEPTIDE: Brain Natriuretic Peptide: 62 pg/mL (ref <100)

## 2019-11-21 LAB — URINE CULTURE
MICRO NUMBER:: 10334326
SPECIMEN QUALITY:: ADEQUATE

## 2019-11-21 NOTE — Progress Notes (Signed)
MRI ordered

## 2019-11-21 NOTE — Addendum Note (Signed)
Addended by: Donella Stade on: 11/21/2019 12:30 PM   Modules accepted: Orders

## 2019-11-22 ENCOUNTER — Telehealth: Payer: Self-pay

## 2019-11-22 MED ORDER — DIAZEPAM 5 MG PO TABS: 5.0000 mg | ORAL_TABLET | Freq: Once | ORAL | 0 refills | Status: AC

## 2019-11-22 NOTE — Telephone Encounter (Signed)
Patient advised imaging department that she needs pre-med for her MRI on Saturday.   Please advise

## 2019-11-22 NOTE — Telephone Encounter (Signed)
Patient advised.

## 2019-11-22 NOTE — Telephone Encounter (Signed)
Sent Valium Needs driver if she is going to take pre-procedure sedatives

## 2019-11-23 NOTE — Progress Notes (Signed)
Urine culture showed very little bacteria suspect more contamination than infection.

## 2019-11-24 ENCOUNTER — Ambulatory Visit (INDEPENDENT_AMBULATORY_CARE_PROVIDER_SITE_OTHER): Payer: Medicare HMO

## 2019-11-24 ENCOUNTER — Other Ambulatory Visit: Payer: Self-pay

## 2019-11-24 DIAGNOSIS — R410 Disorientation, unspecified: Secondary | ICD-10-CM | POA: Diagnosis not present

## 2019-11-24 DIAGNOSIS — R4182 Altered mental status, unspecified: Secondary | ICD-10-CM | POA: Diagnosis not present

## 2019-11-26 NOTE — Progress Notes (Signed)
Evelyn Greer,   MRI does not show any acute changes since 2019. Ongoing changes in brain due to elevated blood pressure and aging brain.

## 2020-01-25 ENCOUNTER — Ambulatory Visit: Payer: Medicare HMO | Admitting: Family Medicine

## 2020-02-15 ENCOUNTER — Other Ambulatory Visit: Payer: Self-pay | Admitting: Family Medicine

## 2020-05-20 ENCOUNTER — Other Ambulatory Visit: Payer: Self-pay | Admitting: Sports Medicine

## 2020-05-20 ENCOUNTER — Other Ambulatory Visit: Payer: Self-pay | Admitting: Family Medicine

## 2020-06-16 DIAGNOSIS — Z0100 Encounter for examination of eyes and vision without abnormal findings: Secondary | ICD-10-CM | POA: Diagnosis not present

## 2020-06-16 DIAGNOSIS — H524 Presbyopia: Secondary | ICD-10-CM | POA: Diagnosis not present

## 2020-06-30 ENCOUNTER — Encounter: Payer: Self-pay | Admitting: Family Medicine

## 2020-06-30 ENCOUNTER — Ambulatory Visit (INDEPENDENT_AMBULATORY_CARE_PROVIDER_SITE_OTHER): Payer: Medicare HMO | Admitting: Family Medicine

## 2020-06-30 VITALS — BP 113/82 | HR 62 | Ht 64.0 in | Wt 156.0 lb

## 2020-06-30 DIAGNOSIS — Z23 Encounter for immunization: Secondary | ICD-10-CM

## 2020-06-30 DIAGNOSIS — E785 Hyperlipidemia, unspecified: Secondary | ICD-10-CM

## 2020-06-30 DIAGNOSIS — M8580 Other specified disorders of bone density and structure, unspecified site: Secondary | ICD-10-CM | POA: Diagnosis not present

## 2020-06-30 DIAGNOSIS — F0390 Unspecified dementia without behavioral disturbance: Secondary | ICD-10-CM | POA: Diagnosis not present

## 2020-06-30 DIAGNOSIS — Z0000 Encounter for general adult medical examination without abnormal findings: Secondary | ICD-10-CM | POA: Diagnosis not present

## 2020-06-30 DIAGNOSIS — F03B0 Unspecified dementia, moderate, without behavioral disturbance, psychotic disturbance, mood disturbance, and anxiety: Secondary | ICD-10-CM

## 2020-06-30 NOTE — Progress Notes (Signed)
Subjective:   Evelyn Greer is a 86 y.o. female who presents for Medicare Annual (Subsequent) preventive examination.  Her daughter-in-law is here today for her appointment as she does have pretty advanced dementia.  Review of Systems    ROS is negative.         Objective:    Today's Vitals   06/30/20 1010 06/30/20 1033  BP: (!) 153/70 113/82  Pulse: 62 62  SpO2: 97%   Weight: 156 lb (70.8 kg)   Height: 5\' 4"  (1.626 m)    Body mass index is 26.78 kg/m.  Advanced Directives 05/19/2017 12/15/2015 09/09/2014  Does Patient Have a Medical Advance Directive? No Yes Yes  Type of Advance Directive - Living will Living will  Does patient want to make changes to medical advance directive? - No - Patient declined No - Patient declined  Copy of [place] in Chart? - No - copy requested No - copy requested  Would patient like information on creating a medical advance directive? No - Patient declined - -    Current Medications (verified) Outpatient Encounter Medications as of 06/30/2020  Medication Sig  . Calcium Carbonate-Vitamin D (CALCIUM-VITAMIN D) 500-200 MG-UNIT tablet Take 1 tablet by mouth 2 (two) times daily.  Marland Kitchen donepezil (ARICEPT) 10 MG tablet TAKE 1 TABLET BY MOUTH AT BEDTIME  . memantine (NAMENDA) 10 MG tablet Take 1 tablet by mouth twice daily  . MULTIPLE MINERALS-VITAMINS PO Take by mouth.  . [DISCONTINUED] vitamin C (ASCORBIC ACID) 500 MG tablet Take 500 mg by mouth daily.  . [DISCONTINUED] vitamin E 200 UNIT capsule Take 200 Units by mouth daily.   No facility-administered encounter medications on file as of 06/30/2020.    Allergies (verified) Niacin and related and Atorvastatin   History: Past Medical History:  Diagnosis Date  . Hearing loss    sensorineural  . Hyperlipidemia   . Hypertension   . Multiple thyroid nodules    nodules 2009 last U/S   Past Surgical History:  Procedure Laterality Date  . ABDOMINAL HYSTERECTOMY     TAH  w/o oophrectomy for fibroids   Family History  Problem Relation Age of Onset  . Heart disease Mother 64       CHF  . Heart disease Father 17       heart attack  . Cancer Sister        premenopausal breast cancer  . Depression Brother 30       committed suicide    Social History   Socioeconomic History  . Marital status: Single    Spouse name: Not on file  . Number of children: Not on file  . Years of education: Not on file  . Highest education level: Not on file  Occupational History  . Occupation: Retired.   Tobacco Use  . Smoking status: Never Smoker  . Smokeless tobacco: Never Used  Substance and Sexual Activity  . Alcohol use: No  . Drug use: No  . Sexual activity: Not Currently  Other Topics Concern  . Not on file  Social History Narrative   Lives alone   Social Determinants of Health   Financial Resource Strain:   . Difficulty of Paying Living Expenses: Not on file  Food Insecurity:   . Worried About Charity fundraiser in the Last Year: Not on file  . Ran Out of Food in the Last Year: Not on file  Transportation Needs:   . Lack of Transportation (Medical):  Not on file  . Lack of Transportation (Non-Medical): Not on file  Physical Activity:   . Days of Exercise per Week: Not on file  . Minutes of Exercise per Session: Not on file  Stress:   . Feeling of Stress : Not on file  Social Connections:   . Frequency of Communication with Friends and Family: Not on file  . Frequency of Social Gatherings with Friends and Family: Not on file  . Attends Religious Services: Not on file  . Active Member of Clubs or Organizations: Not on file  . Attends Archivist Meetings: Not on file  . Marital Status: Not on file    Tobacco Counseling Counseling given: Not Answered   Clinical Intake:    They do have a couple concerns including the fact that she is bathing less frequently.  She will argue that she is already done it and she really has not.              Diabetic?nO         Activities of Daily Living In your present state of health, do you have any difficulty performing the following activities: 06/30/2020  Hearing? Y  Vision? N  Difficulty concentrating or making decisions? Y  Walking or climbing stairs? N  Dressing or bathing? Y  Doing errands, shopping? Y  Preparing Food and eating ? Y  Using the Toilet? N  In the past six months, have you accidently leaked urine? N  Do you have problems with loss of bowel control? N  Managing your Medications? Y  Managing your Finances? Y  Housekeeping or managing your Housekeeping? Y  Some recent data might be hidden    Patient Care Team: Hali Marry, MD as PCP - General (Family Medicine)  Indicate any recent Medical Services you may have received from other than [facility] providers in the past year (date may be approximate).     Assessment:   This is a routine wellness examination for [name].  Hearing/Vision screen No exam data present  Dietary issues and exercise activities discussed: Current Exercise Habits: The patient does not participate in regular exercise at present, Exercise limited by: None identified  Goals    . Increase physical activity     Please try to walk for 10-15 minutes every day. Work on stretching as well.    Please SHOWER AND [place] ON Wednesday AND Sunday EACH WEEK      Depression Screen [facility] 2/9 Scores 11/20/2019 04/02/2019 05/23/2018 05/19/2017 12/15/2015 09/09/2014 03/22/2013  PHQ - 2 Score 1 0 2 0 0 1 0  PHQ- 9 Score 8 - 8 - - - -    Fall Risk Fall Risk  06/30/2020 06/26/2019 04/02/2019 08/10/2018 05/23/2018  Falls in the past year? 0 1 0 1 No  Number falls in past yr: - 0 0 0 -  Injury with Fall? - 0 0 1 -  Risk for fall due to : Mental status change Mental status change Mental status change History of fall(s);Other (Comment) -  Follow up Falls prevention discussed - Falls prevention discussed Falls prevention discussed -      ASSISTIVE DEVICES UTILIZED TO PREVENT FALLS:  Life alert? No  Use of a cane, walker or w/c? No   TIMED UP AND GO:  Was the test performed? No .   Gait slow and steady without use of assistive device  Cognitive Function: MMSE - Mini Mental State Exam 06/26/2019 05/23/2018 06/02/2017  Orientation to time  1 2 2   Orientation to Place 4 4 3   Registration 3 3 3   Attention/ Calculation 5 0 4  Recall 0 0 0  Language- name 2 objects 2 2 2   Language- repeat 0 0 1  Language- follow 3 step command 3 3 3   Language- read & follow direction 1 1 1   Write a sentence 1 0 1  Copy design 1 1 1   Total score 21 16 21      6CIT Screen 06/30/2020 05/19/2017  What Year? 4 points 4 points  What month? 0 points 3 points  What time? 0 points 0 points  Count back from 20 2 points 4 points  Months in reverse 2 points 4 points  Repeat phrase 8 points 8 points  Total Score 16 23    Immunizations Immunization History  Administered Date(s) Administered  . Fluad Quad(high Dose 65+) 04/03/2019, 06/30/2020  . Influenza, High Dose Seasonal PF 05/19/2017  . Influenza,inj,Quad PF,6+ Mos 09/09/2014  . Janssen (J&J) SARS-COV-2 Vaccination 10/28/2019  . Pneumococcal Conjugate-13 09/09/2014  . Pneumococcal Polysaccharide-23 01/05/2010  . Tdap 02/22/2011    TDAP status: Up to date Flu Vaccine status: Completed at today's visit Pneumococcal vaccine status: Up to date Covid-19 vaccine status: Completed vaccines  Qualifies for Shingles Vaccine? Yes   Zostavax completed No   Shingrix Completed?: No.    Education has been provided regarding the importance of this vaccine. Patient has been advised to call insurance company to determine out of pocket expense if they have not yet received this vaccine. Advised may also receive vaccine at local pharmacy or Health Dept. Verbalized acceptance and understanding.  Screening Tests Health Maintenance  Topic Date Due  . TETANUS/TDAP  02/21/2021  . INFLUENZA  VACCINE  Completed  . DEXA SCAN  Completed  . COVID-19 Vaccine  Completed  . PNA vac Low Risk Adult  Completed    Health Maintenance  There are no preventive care reminders to display for this patient.  Colorectal cancer screening: No longer required. she was supposed to follow-up with the colorectal surgeon back in 2018 but she and her family report that she is doing well and they're not sure they really want to continue to follow-up on the issue or not I'm happy to make referral if they would like so just encouraged him to think about it. Mammogram status: No longer required.  Bone Density status: Ordered 06/30/2020. Pt provided with contact info and advised to call to schedule appt.  Lung Cancer Screening: (Low Dose CT Chest recommended if Age 59-80 years, 30 pack-year currently smoking OR have quit w/in 15years.) does not qualify.   Lung Cancer Screening Referral: NA  Additional Screening:  Hepatitis C Screening: does not qualify;  Vision Screening: Recommended annual ophthalmology exams for early detection of glaucoma and other disorders of the eye. Is the patient up to date with their annual eye exam?  No  If pt is not established with a provider, would they like to be referred to a provider to establish care? No .   Dental Screening: Recommended annual dental exams for proper oral hygiene  Community Resource Referral / Chronic Care Management: CRR required this visit?  No   CCM required this visit?  No      Plan:     I have personally reviewed and noted the following in the patient's chart:   . Medical and social history . Use of alcohol, tobacco or illicit drugs  . Current medications and supplements . Functional  ability and status . Nutritional status . Physical activity . Advanced directives . List of other physicians . Hospitalizations, surgeries, and ER visits in previous 12 months . Vitals . Screenings to include cognitive, depression, and  falls . Referrals and appointments  In addition, I have reviewed and discussed with patient certain preventive protocols, quality metrics, and best practice recommendations. A written personalized care plan for preventive services as well as general preventive health recommendations were provided to patient.     Beatrice Lecher, MD   07/07/2020

## 2020-06-30 NOTE — Patient Instructions (Signed)
  Evelyn Greer , Thank you for taking time to come for your Medicare Wellness Visit. I appreciate your ongoing commitment to your health goals. Please review the following plan we discussed and let me know if I can assist you in the future.   These are the goals we discussed: Goals    . Increase physical activity     Please try to walk for 10-15 minutes every day. Work on stretching as well.    Please SHOWER AND [place] ON Wednesday AND Sunday EACH WEEK       This is a list of the screening recommended for you and due dates:  Health Maintenance  Topic Date Due  . Tetanus Vaccine  02/21/2021  . Flu Shot  Completed  . DEXA scan (bone density measurement)  Completed  . COVID-19 Vaccine  Completed  . Pneumonia vaccines  Completed

## 2020-07-01 LAB — COMPLETE METABOLIC PANEL WITH GFR
AG Ratio: 1.7 (calc) (ref 1.0–2.5)
ALT: 20 U/L (ref 6–29)
AST: 23 U/L (ref 10–35)
Albumin: 4.3 g/dL (ref 3.6–5.1)
Alkaline phosphatase (APISO): 57 U/L (ref 37–153)
BUN/Creatinine Ratio: 13 (calc) (ref 6–22)
BUN: 12 mg/dL (ref 7–25)
CO2: 25 mmol/L (ref 20–32)
Calcium: 9.5 mg/dL (ref 8.6–10.4)
Chloride: 108 mmol/L (ref 98–110)
Creat: 0.93 mg/dL — ABNORMAL HIGH (ref 0.60–0.88)
GFR, Est African American: 65 mL/min/{1.73_m2} (ref >=60)
GFR, Est Non African American: 56 mL/min/{1.73_m2} — ABNORMAL LOW (ref >=60)
Globulin: 2.6 g/dL (calc) (ref 1.9–3.7)
Glucose, Bld: 97 mg/dL (ref 65–99)
Potassium: 3.8 mmol/L (ref 3.5–5.3)
Sodium: 141 mmol/L (ref 135–146)
Total Bilirubin: 0.6 mg/dL (ref 0.2–1.2)
Total Protein: 6.9 g/dL (ref 6.1–8.1)

## 2020-07-01 LAB — CBC
HCT: 42.2 % (ref 35.0–45.0)
Hemoglobin: 14.2 g/dL (ref 11.7–15.5)
MCH: 29.3 pg (ref 27.0–33.0)
MCHC: 33.6 g/dL (ref 32.0–36.0)
MCV: 87.2 fL (ref 80.0–100.0)
MPV: 11.2 fL (ref 7.5–12.5)
Platelets: 201 10*3/uL (ref 140–400)
RBC: 4.84 10*6/uL (ref 3.80–5.10)
RDW: 12.5 % (ref 11.0–15.0)
WBC: 5.7 10*3/uL (ref 3.8–10.8)

## 2020-07-01 LAB — LIPID PANEL
Cholesterol: 245 mg/dL — ABNORMAL HIGH (ref <200)
HDL: 54 mg/dL (ref >=50)
LDL Cholesterol (Calc): 167 mg/dL (calc) — ABNORMAL HIGH
Non-HDL Cholesterol (Calc): 191 mg/dL (calc) — ABNORMAL HIGH (ref <130)
Total CHOL/HDL Ratio: 4.5 (calc) (ref <5.0)
Triglycerides: 113 mg/dL (ref <150)

## 2020-07-02 ENCOUNTER — Encounter: Payer: Self-pay | Admitting: Family Medicine

## 2020-07-07 ENCOUNTER — Encounter: Payer: Self-pay | Admitting: Family Medicine

## 2020-08-15 DIAGNOSIS — Z20822 Contact with and (suspected) exposure to covid-19: Secondary | ICD-10-CM | POA: Diagnosis not present

## 2020-08-15 DIAGNOSIS — Z79899 Other long term (current) drug therapy: Secondary | ICD-10-CM | POA: Diagnosis not present

## 2020-08-15 DIAGNOSIS — R509 Fever, unspecified: Secondary | ICD-10-CM | POA: Diagnosis not present

## 2020-08-15 DIAGNOSIS — R918 Other nonspecific abnormal finding of lung field: Secondary | ICD-10-CM | POA: Diagnosis not present

## 2020-08-15 DIAGNOSIS — I517 Cardiomegaly: Secondary | ICD-10-CM | POA: Diagnosis not present

## 2020-08-15 DIAGNOSIS — E785 Hyperlipidemia, unspecified: Secondary | ICD-10-CM | POA: Diagnosis not present

## 2020-08-15 DIAGNOSIS — Z888 Allergy status to other drugs, medicaments and biological substances status: Secondary | ICD-10-CM | POA: Diagnosis not present

## 2020-08-15 DIAGNOSIS — J189 Pneumonia, unspecified organism: Secondary | ICD-10-CM | POA: Diagnosis not present

## 2020-08-15 DIAGNOSIS — J168 Pneumonia due to other specified infectious organisms: Secondary | ICD-10-CM | POA: Diagnosis not present

## 2020-08-15 DIAGNOSIS — F0390 Unspecified dementia without behavioral disturbance: Secondary | ICD-10-CM | POA: Diagnosis not present

## 2020-09-03 ENCOUNTER — Other Ambulatory Visit: Payer: Self-pay | Admitting: Family Medicine

## 2020-09-25 ENCOUNTER — Ambulatory Visit (INDEPENDENT_AMBULATORY_CARE_PROVIDER_SITE_OTHER): Payer: Medicare HMO | Admitting: Family Medicine

## 2020-09-25 ENCOUNTER — Other Ambulatory Visit: Payer: Self-pay

## 2020-09-25 ENCOUNTER — Encounter: Payer: Self-pay | Admitting: Family Medicine

## 2020-09-25 VITALS — BP 147/86 | HR 92 | Wt 150.0 lb

## 2020-09-25 DIAGNOSIS — B372 Candidiasis of skin and nail: Secondary | ICD-10-CM | POA: Diagnosis not present

## 2020-09-25 DIAGNOSIS — M79672 Pain in left foot: Secondary | ICD-10-CM | POA: Diagnosis not present

## 2020-09-25 MED ORDER — KETOCONAZOLE 2 % EX CREA: 1.0000 "application " | TOPICAL_CREAM | Freq: Every day | CUTANEOUS | 2 refills | Status: AC

## 2020-09-25 NOTE — Progress Notes (Signed)
Acute Office Visit  Subjective:    Patient ID: Evelyn Greer, female    DOB: 1934-04-09, 87 y.o.   MRN: [id number]  No chief complaint on file.   HPI Patient is in for left foot discomfort between her toes. Reports this starting about a month ago. Daughter-in-law present at visit stated this is very similar to a fungal infection 09/01/17 that resolved with ketoconazole cream after seeing Dr. Madilyn Greer. They state her toes have some white patches between them. She tends to wear socks and tight shoes most of the time. (History of bunions and calloused areas, but not bothering patient recently. Gout work-up in 2019 negative, no concerns about this today). Previous ulcer noted on left medial 5th toe with picture in Dr. Gardiner Greer note from 2019. Appears more as old/scarring today with no signs of active infection today.  Denies pain on palpation. No joint tenderness. No erythema or edema. Bilateral toes mildly dusky with good pedal pulses and quick cap refill. Full sensation.    FOOT PAIN Duration: weeks Involved foot: left Mechanism of injury: none Location:  Onset: gradual  Severity: 6/10  Quality:  burning, itchy, sore, tearing and throbbing Frequency: intermittent Radiation: no Aggravating factors: walking  Alleviating factors: nothing  Status: stable Treatments attempted: none  Weakness with weight bearing or walking: no Morning stiffness: no Swelling: no Redness: no Bruising: no Paresthesias / decreased sensation: no  Fevers:no    Past Medical History:  Diagnosis Date  . Hearing loss    sensorineural  . Hyperlipidemia   . Hypertension   . Multiple thyroid nodules    nodules 2009 last U/S    Past Surgical History:  Procedure Laterality Date  . ABDOMINAL HYSTERECTOMY     TAH w/o oophrectomy for fibroids    Family History  Problem Relation Age of Onset  . Heart disease Mother 26       CHF  . Heart disease Father 15       heart attack  . Cancer Sister         premenopausal breast cancer  . Depression Brother 34       committed suicide     Social History   Socioeconomic History  . Marital status: Single    Spouse name: Not on file  . Number of children: Not on file  . Years of education: Not on file  . Highest education level: Not on file  Occupational History  . Occupation: Retired.   Tobacco Use  . Smoking status: Never Smoker  . Smokeless tobacco: Never Used  Substance and Sexual Activity  . Alcohol use: No  . Drug use: No  . Sexual activity: Not Currently  Other Topics Concern  . Not on file  Social History Narrative   Lives alone   Social Determinants of Health   Financial Resource Strain: Not on file  Food Insecurity: Not on file  Transportation Needs: Not on file  Physical Activity: Not on file  Stress: Not on file  Social Connections: Not on file  Intimate Partner Violence: Not on file    Outpatient Medications Prior to Visit  Medication Sig Dispense Refill  . Calcium Carbonate-Vitamin D (CALCIUM-VITAMIN D) 500-200 MG-UNIT tablet Take 1 tablet by mouth 2 (two) times daily.    Marland Kitchen donepezil (ARICEPT) 10 MG tablet TAKE 1 TABLET BY MOUTH AT BEDTIME 90 tablet 0  . memantine (NAMENDA) 10 MG tablet Take 1 tablet by mouth twice daily 180 tablet 0  . MULTIPLE MINERALS-VITAMINS PO  Take by mouth.     No facility-administered medications prior to visit.    Allergies  Allergen Reactions  . Niacin And Related Other (See Comments)    Flush,tingling in legs  . Atorvastatin     REACTION: Pain in legs    Review of Systems    All review of systems negative except what is listed in the HPI  Objective:    Physical Exam Vitals reviewed.  Constitutional:      Appearance: Normal appearance.  HENT:     Head: Normocephalic and atraumatic.  Cardiovascular:     Rate and Rhythm: Normal rate and regular rhythm.     Pulses: Normal pulses.  Skin:    Capillary Refill: Capillary refill takes less than 2 seconds.      Comments: Yeast-type, white, flaky between toes of left foot, particularly between 3rd -5th toes  Neurological:     Mental Status: She is alert. Mental status is at baseline.  Psychiatric:        Mood and Affect: Mood normal.        Behavior: Behavior normal.        Thought Content: Thought content normal.     There were no vitals taken for this visit. Wt Readings from Last 3 Encounters:  06/30/20 156 lb (70.8 kg)  11/20/19 153 lb (69.4 kg)  10/18/19 154 lb (69.9 kg)    Health Maintenance Due  Topic Date Due  . COVID-19 Vaccine (2 - Booster for Janssen series) 12/23/2019    There are no preventive care reminders to display for this patient.   Lab Results  Component Value Date   TSH 2.79 11/20/2019   Lab Results  Component Value Date   WBC 5.7 06/30/2020   HGB 14.2 06/30/2020   HCT 42.2 06/30/2020   MCV 87.2 06/30/2020   PLT 201 06/30/2020   Lab Results  Component Value Date   NA 141 06/30/2020   K 3.8 06/30/2020   CO2 25 06/30/2020   GLUCOSE 97 06/30/2020   BUN 12 06/30/2020   CREATININE 0.93 (H) 06/30/2020   BILITOT 0.6 06/30/2020   ALKPHOS 58 01/21/2018   AST 23 06/30/2020   ALT 20 06/30/2020   PROT 6.9 06/30/2020   ALBUMIN 4.1 01/21/2018   CALCIUM 9.5 06/30/2020   ANIONGAP 7 01/21/2018   Lab Results  Component Value Date   CHOL 245 (H) 06/30/2020   Lab Results  Component Value Date   HDL 54 06/30/2020   Lab Results  Component Value Date   LDLCALC 167 (H) 06/30/2020   Lab Results  Component Value Date   TRIG 113 06/30/2020   Lab Results  Component Value Date   CHOLHDL 4.5 06/30/2020   Lab Results  Component Value Date   HGBA1C 5.0 11/20/2019       Assessment & Plan:   1. Left foot pain 2. Yeast dermatitis Mild yeast infection between toes of left foot (3rd-5th). Similar episode resolved with ketoconazole cream 3 years ago so we will try this again. Patient again reminded to wear shoes that are not so tight and try to let feet  "air-out" and fully dry between toes before putting socks on. Will recheck in 10 days or so to see if the yeast infection has improved.   Prior to leaving, daughter quickly mentioned that patient's legs seemed a little swollen to her this afternoon. No pitting edema on exam, no redness or tenderness. Encouraged her to prop her legs up some and keep an  eye on this. We can reevaluate at follow-up visit to ensure it is not worsening.   Follow-up sooner if symptoms worsen or fail to improve.   Terrilyn Saver, NP

## 2020-09-25 NOTE — Patient Instructions (Signed)
I will send in the cream for the yeast between your toes.  The prior ulceration does not appear infected at this time, but we may be able to get a better view of it when the yeast clears. Come back in 10-14 days to recheck.. Athlete's Foot  Athlete's foot (tinea pedis) is a fungal infection of the skin on your feet. It often occurs on the skin that is between or underneath your toes. It can also occur on the soles of your feet. Symptoms include itchy or white and flaky areas on the skin. The infection can spread from person to person (is contagious). It can also spread when a person's bare feet come in contact with the fungus on shower floors or on items such as shoes. Follow these instructions at home: Medicines  Apply or take over-the-counter and prescription medicines only as told by your doctor.  Apply your antifungal medicine as told by your doctor. Do not stop using the medicine even if your feet start to get better. Foot care  Do not scratch your feet.  Keep your feet dry: ? Wear cotton or wool socks. Change your socks every day or if they become wet. ? Wear shoes that allow air to move around, such as sandals or canvas tennis shoes.  Wash and dry your feet: ? Every day or as told by your doctor. ? After exercising. ? Including the area between your toes. General instructions  Do not share any of these items that touch your feet: ? Towels. ? Shoes. ? Nail clippers. ? Other personal items.  Protect your feet by wearing sandals in wet areas, such as locker rooms and shared showers.  Keep all follow-up visits as told by your doctor. This is important.  If you have diabetes, keep your blood sugar under control. Contact a doctor if:  You have a fever.  You have swelling, pain, warmth, or redness in your foot.  Your feet are not getting better with treatment.  Your symptoms get worse.  You have new symptoms. Summary  Athlete's foot is a fungal infection of the skin  on your feet.  Symptoms include itchy or white and flaky areas on the skin.  Apply your antifungal medicine as told by your doctor.  Keep your feet clean and dry. This information is not intended to replace advice given to you by your health care provider. Make sure you discuss any questions you have with your health care provider. Document Revised: 03/20/2020 Document Reviewed: 03/20/2020 Elsevier Patient Education  2021 [employer].

## 2020-11-29 DIAGNOSIS — I774 Celiac artery compression syndrome: Secondary | ICD-10-CM | POA: Diagnosis not present

## 2020-11-29 DIAGNOSIS — E876 Hypokalemia: Secondary | ICD-10-CM | POA: Diagnosis not present

## 2020-11-29 DIAGNOSIS — I7779 Dissection of other artery: Secondary | ICD-10-CM | POA: Diagnosis not present

## 2020-11-29 DIAGNOSIS — K5750 Diverticulosis of both small and large intestine without perforation or abscess without bleeding: Secondary | ICD-10-CM | POA: Diagnosis not present

## 2020-11-29 DIAGNOSIS — J90 Pleural effusion, not elsewhere classified: Secondary | ICD-10-CM | POA: Diagnosis not present

## 2020-11-29 DIAGNOSIS — R0689 Other abnormalities of breathing: Secondary | ICD-10-CM | POA: Diagnosis not present

## 2020-11-29 DIAGNOSIS — R55 Syncope and collapse: Secondary | ICD-10-CM | POA: Diagnosis not present

## 2020-11-29 DIAGNOSIS — R404 Transient alteration of awareness: Secondary | ICD-10-CM | POA: Diagnosis not present

## 2020-11-29 DIAGNOSIS — F0390 Unspecified dementia without behavioral disturbance: Secondary | ICD-10-CM | POA: Diagnosis not present

## 2020-11-29 DIAGNOSIS — R4020 Unspecified coma: Secondary | ICD-10-CM | POA: Diagnosis not present

## 2020-11-29 DIAGNOSIS — R0789 Other chest pain: Secondary | ICD-10-CM | POA: Diagnosis not present

## 2020-11-29 DIAGNOSIS — I517 Cardiomegaly: Secondary | ICD-10-CM | POA: Diagnosis not present

## 2020-11-29 DIAGNOSIS — I4891 Unspecified atrial fibrillation: Secondary | ICD-10-CM | POA: Diagnosis not present

## 2020-11-29 DIAGNOSIS — I7103 Dissection of thoracoabdominal aorta: Secondary | ICD-10-CM | POA: Diagnosis not present

## 2020-11-29 DIAGNOSIS — Z66 Do not resuscitate: Secondary | ICD-10-CM | POA: Diagnosis not present

## 2020-11-29 DIAGNOSIS — S22070A Wedge compression fracture of T9-T10 vertebra, initial encounter for closed fracture: Secondary | ICD-10-CM | POA: Diagnosis not present

## 2020-11-29 DIAGNOSIS — Z515 Encounter for palliative care: Secondary | ICD-10-CM | POA: Diagnosis not present

## 2020-11-29 DIAGNOSIS — Z20822 Contact with and (suspected) exposure to covid-19: Secondary | ICD-10-CM | POA: Diagnosis not present

## 2020-11-29 DIAGNOSIS — R079 Chest pain, unspecified: Secondary | ICD-10-CM | POA: Diagnosis not present

## 2020-11-29 DIAGNOSIS — R0902 Hypoxemia: Secondary | ICD-10-CM | POA: Diagnosis not present

## 2020-11-29 DIAGNOSIS — R000 Tachycardia, unspecified: Secondary | ICD-10-CM | POA: Diagnosis not present

## 2020-11-29 DIAGNOSIS — I313 Pericardial effusion (noninflammatory): Secondary | ICD-10-CM | POA: Diagnosis not present

## 2020-12-02 ENCOUNTER — Other Ambulatory Visit: Payer: Self-pay

## 2020-12-02 NOTE — Patient Outreach (Signed)
[facility]) Care Management  12/02/2020  Evelyn Greer 05-12-1934 [id number]     Transition of Care Referral  Referral Date: 12/02/2020  Referral Source: [facility] Discharge Report Date of Discharge: 12/07/2020 Facility: [facility] Insurance: [facility] Medicare    Referral received. Upon chart review noted that patient expired during hospitalization.    Plan: RN CM will close referral.    Enzo Montgomery, RN,BSN,CCM [place] Management Telephonic Care Management Coordinator Direct Phone: [phone] Toll Free: [phone] Fax: [phone]

## 2020-12-03 ENCOUNTER — Telehealth: Payer: Self-pay | Admitting: Family Medicine

## 2020-12-03 NOTE — Telephone Encounter (Signed)
Patient passed away on 04/17. Daughter in law called wanting to let us know and to note the chart.

## 2020-12-14 DEATH — deceased
